# Patient Record
Sex: Female | Born: 1960 | Race: White | Hispanic: No | Marital: Married | Smoking: Current every day smoker
Health system: Southern US, Community
[De-identification: ages and names within clinical notes are randomized; demographics above are authoritative.]

## PROBLEM LIST (undated history)

## (undated) DIAGNOSIS — E785 Hyperlipidemia, unspecified: Secondary | ICD-10-CM

## (undated) DIAGNOSIS — K819 Cholecystitis, unspecified: Secondary | ICD-10-CM

## (undated) DIAGNOSIS — F329 Major depressive disorder, single episode, unspecified: Secondary | ICD-10-CM

## (undated) DIAGNOSIS — F32A Depression, unspecified: Secondary | ICD-10-CM

## (undated) HISTORY — DX: Cholecystitis, unspecified: K81.9

## (undated) HISTORY — DX: Hyperlipidemia, unspecified: E78.5

## (undated) HISTORY — DX: Major depressive disorder, single episode, unspecified: F32.9

## (undated) HISTORY — DX: Depression, unspecified: F32.A

---
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
## 2011-03-21 ENCOUNTER — Emergency Department: Payer: Self-pay | Admitting: Emergency Medicine

## 2014-05-13 ENCOUNTER — Emergency Department: Payer: Self-pay | Admitting: Emergency Medicine

## 2015-07-10 ENCOUNTER — Emergency Department
Admission: EM | Admit: 2015-07-10 | Discharge: 2015-07-10 | Disposition: A | Discharge status: Home / Self Care | Payer: Worker's Compensation | Attending: Emergency Medicine | Admitting: Emergency Medicine

## 2015-07-10 ENCOUNTER — Encounter: Payer: Self-pay | Admitting: Emergency Medicine

## 2015-07-10 DIAGNOSIS — Z880 Allergy status to penicillin: Secondary | ICD-10-CM | POA: Diagnosis not present

## 2015-07-10 DIAGNOSIS — W460XXA Contact with hypodermic needle, initial encounter: Secondary | ICD-10-CM | POA: Diagnosis not present

## 2015-07-10 DIAGNOSIS — Y990 Civilian activity done for income or pay: Secondary | ICD-10-CM | POA: Insufficient documentation

## 2015-07-10 DIAGNOSIS — Y9389 Activity, other specified: Secondary | ICD-10-CM | POA: Insufficient documentation

## 2015-07-10 DIAGNOSIS — Y9289 Other specified places as the place of occurrence of the external cause: Secondary | ICD-10-CM | POA: Insufficient documentation

## 2015-07-10 DIAGNOSIS — S61233A Puncture wound without foreign body of left middle finger without damage to nail, initial encounter: Secondary | ICD-10-CM | POA: Insufficient documentation

## 2015-07-10 DIAGNOSIS — Z720 Tobacco use: Secondary | ICD-10-CM | POA: Insufficient documentation

## 2015-07-10 DIAGNOSIS — Z7721 Contact with and (suspected) exposure to potentially hazardous body fluids: Secondary | ICD-10-CM | POA: Diagnosis present

## 2015-07-10 DIAGNOSIS — IMO0002 Reserved for concepts with insufficient information to code with codable children: Secondary | ICD-10-CM

## 2015-07-10 LAB — CBC WITH DIFFERENTIAL/PLATELET
BASOS PCT: 1 %
Basophils Absolute: 0 10*3/uL (ref 0–0.1)
EOS ABS: 0.1 10*3/uL (ref 0–0.7)
EOS PCT: 1 %
HEMATOCRIT: 42.5 % (ref 35.0–47.0)
Hemoglobin: 14.2 g/dL (ref 12.0–16.0)
Lymphocytes Relative: 43 %
Lymphs Abs: 3.7 10*3/uL — ABNORMAL HIGH (ref 1.0–3.6)
MCH: 31.5 pg (ref 26.0–34.0)
MCHC: 33.5 g/dL (ref 32.0–36.0)
MCV: 94 fL (ref 80.0–100.0)
MONO ABS: 0.6 10*3/uL (ref 0.2–0.9)
MONOS PCT: 7 %
NEUTROS ABS: 4.2 10*3/uL (ref 1.4–6.5)
Neutrophils Relative %: 48 %
PLATELETS: 186 10*3/uL (ref 150–440)
RBC: 4.52 MIL/uL (ref 3.80–5.20)
RDW: 13.6 % (ref 11.5–14.5)
WBC: 8.6 10*3/uL (ref 3.6–11.0)

## 2015-07-10 LAB — COMPREHENSIVE METABOLIC PANEL
ALBUMIN: 4.2 g/dL (ref 3.5–5.0)
ALK PHOS: 110 U/L (ref 38–126)
ALT: 18 U/L (ref 14–54)
ANION GAP: 8 (ref 5–15)
AST: 26 U/L (ref 15–41)
BILIRUBIN TOTAL: 0.4 mg/dL (ref 0.3–1.2)
BUN: 13 mg/dL (ref 6–20)
CALCIUM: 9.3 mg/dL (ref 8.9–10.3)
CO2: 25 mmol/L (ref 22–32)
Chloride: 106 mmol/L (ref 101–111)
Creatinine, Ser: 0.66 mg/dL (ref 0.44–1.00)
GFR calc Af Amer: >60 mL/min (ref >60)
GLUCOSE: 117 mg/dL — AB (ref 65–99)
Potassium: 3.7 mmol/L (ref 3.5–5.1)
Sodium: 139 mmol/L (ref 135–145)
TOTAL PROTEIN: 7.6 g/dL (ref 6.5–8.1)

## 2015-07-10 LAB — URINALYSIS COMPLETE WITH MICROSCOPIC (ARMC ONLY)
BILIRUBIN URINE: NEGATIVE
GLUCOSE, UA: NEGATIVE mg/dL
HGB URINE DIPSTICK: NEGATIVE
KETONES UR: NEGATIVE mg/dL
Leukocytes, UA: NEGATIVE
NITRITE: NEGATIVE
Protein, ur: NEGATIVE mg/dL
RBC / HPF: NONE SEEN RBC/hpf (ref 0–5)
Specific Gravity, Urine: 1.001 — ABNORMAL LOW (ref 1.005–1.030)
pH: 6 (ref 5.0–8.0)

## 2015-07-10 MED ORDER — ZIDOVUDINE 100 MG PO CAPS
300.0000 mg | ORAL_CAPSULE | Freq: Two times a day (BID) | ORAL | Status: DC
  Filled 2015-07-10 (×2): qty 3

## 2015-07-10 MED ORDER — LAMIVUDINE 150 MG PO TABS
150.0000 mg | ORAL_TABLET | Freq: Two times a day (BID) | ORAL | Status: DC
  Filled 2015-07-10 (×2): qty 1

## 2015-07-10 MED ORDER — LAMIVUDINE-ZIDOVUDINE 150-300 MG PO TABS
1.0000 | ORAL_TABLET | Freq: Two times a day (BID) | ORAL | Status: DC
  Filled 2015-07-10 (×2): qty 1

## 2015-07-10 MED ORDER — ZIDOVUDINE 300 MG PO TABS
300.0000 mg | ORAL_TABLET | Freq: Two times a day (BID) | ORAL | Status: DC
  Administered 2015-07-10: 300 mg via ORAL
  Filled 2015-07-10 (×2): qty 1

## 2015-07-10 MED ORDER — LAMIVUDINE-ZIDOVUDINE 150-300 MG PO TABS: 1.0000 | ORAL_TABLET | Freq: Two times a day (BID) | ORAL | Status: DC

## 2015-07-10 MED ORDER — LAMIVUDINE 150 MG PO TABS
150.0000 mg | ORAL_TABLET | Freq: Two times a day (BID) | ORAL | Status: DC
  Administered 2015-07-10: 150 mg via ORAL
  Filled 2015-07-10 (×2): qty 1

## 2015-07-10 NOTE — ED Notes (Signed)
rcvd consent form for HIV and other testing related to exposure

## 2015-07-10 NOTE — ED Notes (Signed)
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 Apparently, a sharp was stuck under the sharps box and she hit it with her left middle finger while reaching for a dropped pill. Pt is alert & oriented with warm, dry skin. Pt wants to review information about prophylaxis treatment before making decision in case there are blood derivatives as she does not take blood products.

## 2015-07-10 NOTE — ED Notes (Signed)
Reports dropping a pill at work and bent down to get it and got stuck by a needle that was under the needle box

## 2015-07-10 NOTE — ED Provider Notes (Addendum)
[facility] Emergency Department Provider Note  ____________________________________________  Time seen: Approximately 9 AM  I have reviewed the triage vital signs and the nursing notes.   HISTORY  Chief Complaint Body Fluid Exposure    HPI Tiffany Shea is a 54 y.o. female without any previous medical problems who is presenting today after a needlestick at 12 AM this morning. 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 She looked down she saw a needle that had stuck her. She said that she saw a small drop of blood with a needle inserted in her finger. She going quickly washed her finger and there has been no further bleeding. She says that the needle may have prolonged to one of 2 patients, one of which who is hepatitis C positive. The patient is reporting to the emergency department today for screening for communicable diseases as well as possible prophylaxis.She denied seeing any blood on the needle itself. Unclear of the bore of the needle.   History reviewed. No pertinent past medical history.  There are no active problems to display for this patient.   History reviewed. No pertinent past surgical history.  No current outpatient prescriptions on file.  Allergies Penicillins  History reviewed. No pertinent family history.  Social History Social History  Substance Use Topics  . Smoking status: Current Every Day Smoker  . Smokeless tobacco: None  . Alcohol Use: No    Review of Systems Constitutional: No fever/chills Eyes: No visual changes. ENT: No sore throat. Cardiovascular: Denies chest pain. Respiratory: Denies shortness of breath. Gastrointestinal: No abdominal pain.  No nausea, no vomiting.  No diarrhea.  No constipation. Genitourinary: Negative for dysuria. Musculoskeletal: Negative for back pain. Skin: Negative for rash. Neurological: Negative for headaches,  focal weakness or numbness.  10-point ROS otherwise negative.  ____________________________________________   PHYSICAL EXAM:  VITAL SIGNS: ED Triage Vitals  Enc Vitals Group     BP 07/10/15 0840 128/67 mmHg     Pulse Rate 07/10/15 0840 71     Resp 07/10/15 0840 16     Temp 07/10/15 0840 97.5 F (36.4 C)     Temp Source 07/10/15 0840 Oral     SpO2 07/10/15 0840 99 %     Weight 07/10/15 0840 170 lb (77.111 kg)     Height 07/10/15 0840  (1.651 m)     Head Cir --      Peak Flow --      Pain Score --      Pain Loc --      Pain Edu? --      Excl. in GC? --     Constitutional: Alert and oriented. Well appearing and in no acute distress. Eyes: Conjunctivae are normal. PERRL. EOMI. Head: Atraumatic. Nose: No congestion/rhinnorhea. Mouth/Throat: Mucous membranes are moist.  Oropharynx non-erythematous. Neck: No stridor.   Cardiovascular: Normal rate, regular rhythm. Grossly normal heart sounds.  Good peripheral circulation. Respiratory: Normal respiratory effort.  No retractions. Lungs CTAB. Gastrointestinal: Soft and nontender. No distention. No abdominal bruits. No CVA tenderness. Musculoskeletal: No lower extremity tenderness nor edema.  No joint effusions. Neurologic:  Normal speech and language. No gross focal neurologic deficits are appreciated. No gait instability. Skin:  Skin is warm, dry and intact. No rash noted. No lesion or scarring or evidence of trauma noted to the left middle finger distal phalanx with the patient says that she  suffered the needle stick. Psychiatric: Mood and affect are normal. Speech and behavior are normal.  ____________________________________________   LABS (all labs ordered are listed, but only abnormal results are displayed)  Labs Reviewed  CBC WITH DIFFERENTIAL/PLATELET - Abnormal; Notable for the following:    Lymphs Abs 3.7 (*)    All other components within normal limits  URINALYSIS COMPLETEWITH MICROSCOPIC ([facility] ONLY) -  Abnormal; Notable for the following:    Color, Urine COLORLESS (*)    APPearance CLEAR (*)    Specific Gravity, Urine 1.001 (*)    Bacteria, UA RARE (*)    Squamous Epithelial / LPF 0-5 (*)    All other components within normal limits  COMPREHENSIVE METABOLIC PANEL - Abnormal; Notable for the following:    Glucose, Bld 117 (*)    All other components within normal limits   ____________________________________________  EKG   ____________________________________________  RADIOLOGY   ____________________________________________   PROCEDURES    ____________________________________________   INITIAL IMPRESSION / ASSESSMENT AND PLAN / ED COURSE  Pertinent labs & imaging results that were available during my care of the patient were reviewed by me and considered in my medical decision making (see chart for details).  ----------------------------------------- 11:47 AM on 07/10/2015 -----------------------------------------  He is with reassuring lab work and urinalysis. Will prescribe Combivir secondary to risk of transmission of communicable disease. I discussed thoroughly with the patient the risks and benefits of the post exposure prophylaxis. She also reviewed the [place] packet for post exposure prophylaxis with me and opted to be prescribed Combivir. She signed the necessary paperwork. She will be discharged home and will [facility] for repeat testing secondary to her postoperative prophylaxis. She will also follow up with her employer to obtain Workmen's Comp.   ____________________________________________   FINAL CLINICAL IMPRESSION(S) / ED DIAGNOSES  Acute needle stick to the left middle finger. Initial visit.    Myrna Blazer, MD 07/10/15 1149  Patient says she had already denies it is as a child but does not know of any results of any hepatitis B titer since.  Myrna Blazer, MD 07/10/15 [phone]

## 2015-08-15 ENCOUNTER — Emergency Department
Admission: EM | Admit: 2015-08-15 | Discharge: 2015-08-15 | Disposition: A | Discharge status: Home / Self Care | Payer: Self-pay | Attending: Emergency Medicine | Admitting: Emergency Medicine

## 2015-08-15 DIAGNOSIS — Z720 Tobacco use: Secondary | ICD-10-CM | POA: Insufficient documentation

## 2015-08-15 DIAGNOSIS — Z880 Allergy status to penicillin: Secondary | ICD-10-CM | POA: Insufficient documentation

## 2015-08-15 DIAGNOSIS — Z79899 Other long term (current) drug therapy: Secondary | ICD-10-CM | POA: Insufficient documentation

## 2015-08-15 DIAGNOSIS — M62830 Muscle spasm of back: Secondary | ICD-10-CM | POA: Insufficient documentation

## 2015-08-15 LAB — CBC WITH DIFFERENTIAL/PLATELET
Basophils Absolute: 0 10*3/uL (ref 0–0.1)
Basophils Relative: 0 %
EOS ABS: 0 10*3/uL (ref 0–0.7)
EOS PCT: 0 %
HCT: 41.2 % (ref 35.0–47.0)
Hemoglobin: 14.3 g/dL (ref 12.0–16.0)
LYMPHS ABS: 3.5 10*3/uL (ref 1.0–3.6)
LYMPHS PCT: 32 %
MCH: 34.2 pg — AB (ref 26.0–34.0)
MCHC: 34.6 g/dL (ref 32.0–36.0)
MCV: 98.8 fL (ref 80.0–100.0)
MONO ABS: 0.9 10*3/uL (ref 0.2–0.9)
Monocytes Relative: 9 %
Neutro Abs: 6.4 10*3/uL (ref 1.4–6.5)
Neutrophils Relative %: 59 %
Platelets: 181 10*3/uL (ref 150–440)
RBC: 4.17 MIL/uL (ref 3.80–5.20)
RDW: 17.4 % — ABNORMAL HIGH (ref 11.5–14.5)
WBC: 10.9 10*3/uL (ref 3.6–11.0)

## 2015-08-15 LAB — COMPREHENSIVE METABOLIC PANEL
ALK PHOS: 97 U/L (ref 38–126)
ALT: 30 U/L (ref 14–54)
ANION GAP: 7 (ref 5–15)
AST: 38 U/L (ref 15–41)
Albumin: 4 g/dL (ref 3.5–5.0)
BUN: 10 mg/dL (ref 6–20)
CALCIUM: 9.1 mg/dL (ref 8.9–10.3)
CO2: 24 mmol/L (ref 22–32)
CREATININE: 0.82 mg/dL (ref 0.44–1.00)
Chloride: 106 mmol/L (ref 101–111)
Glucose, Bld: 121 mg/dL — ABNORMAL HIGH (ref 65–99)
Potassium: 4.7 mmol/L (ref 3.5–5.1)
SODIUM: 137 mmol/L (ref 135–145)
TOTAL PROTEIN: 7.2 g/dL (ref 6.5–8.1)
Total Bilirubin: 0.8 mg/dL (ref 0.3–1.2)

## 2015-08-15 LAB — LIPASE, BLOOD: LIPASE: 27 U/L (ref 22–51)

## 2015-08-15 MED ORDER — DIAZEPAM 5 MG PO TABS
2.5000 mg | ORAL_TABLET | Freq: Once | ORAL | Status: AC
  Administered 2015-08-15: 2.5 mg via ORAL
  Filled 2015-08-15: qty 1

## 2015-08-15 MED ORDER — TRAMADOL HCL 50 MG PO TABS
100.0000 mg | ORAL_TABLET | Freq: Once | ORAL | Status: AC
  Administered 2015-08-15: 100 mg via ORAL
  Filled 2015-08-15: qty 2

## 2015-08-15 MED ORDER — DIAZEPAM 2 MG PO TABS: 2.0000 mg | ORAL_TABLET | Freq: Once | ORAL | Status: DC

## 2015-08-15 MED ORDER — TRAMADOL HCL 50 MG PO TABS: 50.0000 mg | ORAL_TABLET | Freq: Four times a day (QID) | ORAL | Status: AC | PRN

## 2015-08-15 NOTE — ED Provider Notes (Signed)
[facility] Emergency Department Provider Note  ____________________________________________  Time seen: 1500   I have reviewed the triage vital signs and the nursing notes.   HISTORY  Chief Complaint Back pain    HPI Tiffany Shea is a 54 y.o. female who presents to the emergency Department complaining about back pain that wraps around her torso. This is acute onset today at approximately 12:00. She reports she was getting out of her car when this pain struck suddenly. She reports pain with movement in her mid back, bilateral.. She denies any nausea, vomiting, or other GI symptoms. She apparently had complained about upper abdominal pain to the nurses in triage. I spoke with the patient about this directly. She denies abdominal pain, but simply the pain wrapped around into that area some. The pain is not as bad now as it was initially. She reports she felt as though her back was spasming. She denies any tearing or other suspicious descriptions of this pain. She denies any pain radiating down into her legs or dysfunction with her legs.    History reviewed. No pertinent past medical history.  There are no active problems to display for this patient.   History reviewed. No pertinent past surgical history.  Current Outpatient Rx  Name  Route  Sig  Dispense  Refill  . diazepam (VALIUM) 2 MG tablet   Oral   Take 1 tablet (2 mg total) by mouth once.   12 tablet   0   . lamiVUDine-zidovudine (COMBIVIR) 150-300 MG per tablet   Oral   Take 1 tablet by mouth 2 (two) times daily.   56 tablet   0   . traMADol (ULTRAM) 50 MG tablet   Oral   Take 1 tablet (50 mg total) by mouth every 6 (six) hours as needed.   20 tablet   0     Allergies Penicillins  History reviewed. No pertinent family history.  Social History Social History  Substance Use Topics  . Smoking status: Current Every Day Smoker  . Smokeless tobacco: None  . Alcohol Use: No    Review of  Systems  Constitutional: Negative for fever. ENT: Negative for sore throat. Cardiovascular: Negative for chest pain. Respiratory: Negative for cough. Gastrointestinal: Negative for abdominal pain, vomiting and diarrhea. Genitourinary: Negative for dysuria. Musculoskeletal: Back pain in the mid back, lower thoracic or see history of present illness. Skin: Negative for rash. Neurological: Negative for paresthesia or weakness   10-point ROS otherwise negative.  ____________________________________________   PHYSICAL EXAM:  VITAL SIGNS: ED Triage Vitals  Enc Vitals Group     BP 08/15/15 1252 138/63 mmHg     Pulse Rate 08/15/15 1252 67     Resp 08/15/15 1252 16     Temp 08/15/15 1252 97.6 F (36.4 C)     Temp Source 08/15/15 1252 Oral     SpO2 08/15/15 1252 97 %     Weight 08/15/15 1252 165 lb (74.844 kg)     Height 08/15/15 1252  (1.651 m)     Head Cir --      Peak Flow --      Pain Score 08/15/15 1252 7     Pain Loc --      Pain Edu? --      Excl. in GC? --     Constitutional:  Alert and oriented. Well appearing and in no distress. ENT   Head: Normocephalic and atraumatic.   Nose: No congestion/rhinnorhea.  Cardiovascular: Normal rate, regular rhythm, no murmur noted Respiratory:  Normal respiratory effort, no tachypnea.    Breath sounds are clear and equal bilaterally.  Gastrointestinal: Soft and nontender. No distention.  Back: Patient with tenderness and muscle spasm bilaterally in the lower thoracic area. Paraspinal muscles are notably tender and tight. She reports that the examination itself feels good and helpful. Of note, she has no neurologic dysfunction noted in her legs. She has equal pulses bilaterally both at the femoral level as well as pedal level. Negative straight leg raise bilaterally. Musculoskeletal: No deformity noted. Nontender with normal range of motion in all extremities.  No noted edema. Neurologic:  Normal speech and language. No  gross focal neurologic deficits are appreciated. Intact sensation in bilateral feet and legs with good motion. Skin:  Skin is warm, dry. No rash noted. Psychiatric: Mood and affect are normal. Speech and behavior are normal.  ____________________________________________    LABS (pertinent positives/negatives)  Labs Reviewed  CBC WITH DIFFERENTIAL/PLATELET - Abnormal; Notable for the following:    MCH 34.2 (*)    RDW 17.4 (*)    All other components within normal limits  COMPREHENSIVE METABOLIC PANEL - Abnormal; Notable for the following:    Glucose, Bld 121 (*)    All other components within normal limits  LIPASE, BLOOD  URINALYSIS COMPLETEWITH MICROSCOPIC ([facility] ONLY)    ____________________________________________   EKG  ED ECG REPORT I, Kelsen Celona W, the attending physician, personally viewed and interpreted this ECG.   Date: 08/15/2015  EKG Time: 1300  Rate: 55  Rhythm: Sinus bradycardia  Axis: Normal  Intervals: Normal  ST&T Change: None noted   ____________________________________________ ____________________________________________   INITIAL IMPRESSION / ASSESSMENT AND PLAN / ED COURSE  Pertinent labs & imaging results that were available during my care of the patient were reviewed by me and considered in my medical decision making (see chart for details).   Pleasant 54 year old female in mild discomfort but no acute distress. She is complaining of pain in her lower thoracic back. She has notable tenderness on exam of this area with muscle spasm. She has no tenderness in her abdomen.   I discussed the differential diagnosis with the patient. I have very low suspicion for vascular issues. The patient I discussed that this is most likely musculoskeletal. We discussed the pros and cons of further testing and she agrees on no further testing at this time.  We will treat her pain with tramadol and we will use Valium for some mild muscle relaxation.  I have  counseled the patient on ongoing light activity and to follow-up with a regular physician. She does not have a regular physician this time.  ____________________________________________   FINAL CLINICAL IMPRESSION(S) / ED DIAGNOSES  Final diagnoses:  Back spasm   back pain    Darien Ramusavid W Jayesh Marbach, MD 08/15/15 1555

## 2015-08-15 NOTE — Discharge Instructions (Signed)
Muscle Cramps and Spasms Muscle cramps and spasms are when muscles tighten by themselves. They usually get better within minutes. Muscle cramps are painful. They are usually stronger and last longer than muscle spasms. Muscle spasms may or may not be painful. They can last a few seconds or much longer. HOME CARE  Drink enough fluid to keep your pee (urine) clear or pale yellow.  Massage, stretch, and relax the muscle.  Use a warm towel, heating pad, or warm shower water on tight muscles.  Place ice on the muscle if it is tender or in pain.  Put ice in a plastic bag.  Place a towel between your skin and the bag.  Leave the ice on for 15-20 minutes, 03-04 times a day.  Only take medicine as told by your doctor. GET HELP RIGHT AWAY IF:  Your cramps or spasms get worse, happen more often, or do not get better with time. MAKE SURE YOU:  Understand these instructions.  Will watch your condition.  Will get help right away if you are not doing well or get worse.   This information is not intended to replace advice given to you by your health care provider. Make sure you discuss any questions you have with your health care provider.   Document Released: 10/02/2008 Document Revised: 02/14/2013 Document Reviewed: 10/06/2012 Elsevier Interactive Patient Education 2016 [employer]  

## 2015-08-15 NOTE — ED Notes (Signed)
Patient had sudden onset of right and left upper quadrant pain that started at 12:30.  Last BM 08/14/15.  Patient is passing gas.

## 2016-11-30 ENCOUNTER — Emergency Department: Payer: Self-pay

## 2016-11-30 ENCOUNTER — Observation Stay
Admission: EM | Admit: 2016-11-30 | Discharge: 2016-12-02 | Disposition: A | Discharge status: Home / Self Care | Payer: Self-pay | Attending: General Surgery | Admitting: General Surgery

## 2016-11-30 ENCOUNTER — Encounter: Payer: Self-pay | Admitting: Emergency Medicine

## 2016-11-30 DIAGNOSIS — Z79899 Other long term (current) drug therapy: Secondary | ICD-10-CM | POA: Insufficient documentation

## 2016-11-30 DIAGNOSIS — R1010 Upper abdominal pain, unspecified: Secondary | ICD-10-CM

## 2016-11-30 DIAGNOSIS — Z7982 Long term (current) use of aspirin: Secondary | ICD-10-CM | POA: Insufficient documentation

## 2016-11-30 DIAGNOSIS — K819 Cholecystitis, unspecified: Secondary | ICD-10-CM | POA: Diagnosis present

## 2016-11-30 DIAGNOSIS — K8010 Calculus of gallbladder with chronic cholecystitis without obstruction: Principal | ICD-10-CM | POA: Insufficient documentation

## 2016-11-30 DIAGNOSIS — F17210 Nicotine dependence, cigarettes, uncomplicated: Secondary | ICD-10-CM | POA: Insufficient documentation

## 2016-11-30 DIAGNOSIS — R109 Unspecified abdominal pain: Secondary | ICD-10-CM

## 2016-11-30 LAB — COMPREHENSIVE METABOLIC PANEL
ALT: 44 U/L (ref 14–54)
AST: 55 U/L — AB (ref 15–41)
Albumin: 4 g/dL (ref 3.5–5.0)
Alkaline Phosphatase: 98 U/L (ref 38–126)
Anion gap: 10 (ref 5–15)
BILIRUBIN TOTAL: 0.8 mg/dL (ref 0.3–1.2)
BUN: 9 mg/dL (ref 6–20)
CHLORIDE: 104 mmol/L (ref 101–111)
CO2: 24 mmol/L (ref 22–32)
CREATININE: 0.74 mg/dL (ref 0.44–1.00)
Calcium: 8.4 mg/dL — ABNORMAL LOW (ref 8.9–10.3)
Glucose, Bld: 186 mg/dL — ABNORMAL HIGH (ref 65–99)
Potassium: 3.3 mmol/L — ABNORMAL LOW (ref 3.5–5.1)
Sodium: 138 mmol/L (ref 135–145)
TOTAL PROTEIN: 7.3 g/dL (ref 6.5–8.1)

## 2016-11-30 LAB — URINALYSIS, COMPLETE (UACMP) WITH MICROSCOPIC
Bilirubin Urine: NEGATIVE
GLUCOSE, UA: NEGATIVE mg/dL
HGB URINE DIPSTICK: NEGATIVE
Ketones, ur: NEGATIVE mg/dL
Leukocytes, UA: NEGATIVE
Nitrite: NEGATIVE
PH: 6 (ref 5.0–8.0)
Protein, ur: NEGATIVE mg/dL
SPECIFIC GRAVITY, URINE: 1.003 — AB (ref 1.005–1.030)

## 2016-11-30 LAB — CBC WITH DIFFERENTIAL/PLATELET
BASOS ABS: 0 10*3/uL (ref 0–0.1)
Basophils Relative: 1 %
EOS PCT: 0 %
Eosinophils Absolute: 0 10*3/uL (ref 0–0.7)
HCT: 39.2 % (ref 35.0–47.0)
Hemoglobin: 13.9 g/dL (ref 12.0–16.0)
LYMPHS ABS: 2.9 10*3/uL (ref 1.0–3.6)
LYMPHS PCT: 29 %
MCH: 32.6 pg (ref 26.0–34.0)
MCHC: 35.4 g/dL (ref 32.0–36.0)
MCV: 92.1 fL (ref 80.0–100.0)
MONO ABS: 1 10*3/uL — AB (ref 0.2–0.9)
Monocytes Relative: 10 %
NEUTROS ABS: 6.2 10*3/uL (ref 1.4–6.5)
Neutrophils Relative %: 60 %
PLATELETS: 172 10*3/uL (ref 150–440)
RBC: 4.25 MIL/uL (ref 3.80–5.20)
RDW: 13.5 % (ref 11.5–14.5)
WBC: 10.1 10*3/uL (ref 3.6–11.0)

## 2016-11-30 LAB — LIPASE, BLOOD: LIPASE: 20 U/L (ref 11–51)

## 2016-11-30 LAB — TROPONIN I

## 2016-11-30 LAB — MRSA PCR SCREENING: MRSA BY PCR: NEGATIVE

## 2016-11-30 MED ORDER — LACTATED RINGERS IV SOLN
INTRAVENOUS | Status: DC
  Administered 2016-11-30 – 2016-12-02 (×4): via INTRAVENOUS

## 2016-11-30 MED ORDER — DIPHENHYDRAMINE HCL 25 MG PO CAPS: 25.0000 mg | ORAL_CAPSULE | Freq: Four times a day (QID) | ORAL | Status: DC | PRN

## 2016-11-30 MED ORDER — FENTANYL CITRATE (PF) 100 MCG/2ML IJ SOLN
50.0000 ug | Freq: Once | INTRAMUSCULAR | Status: AC
  Administered 2016-11-30: 50 ug via INTRAVENOUS
  Filled 2016-11-30: qty 2

## 2016-11-30 MED ORDER — SODIUM CHLORIDE 0.9 % IV BOLUS (SEPSIS)
1000.0000 mL | Freq: Once | INTRAVENOUS | Status: AC
  Administered 2016-11-30: 1000 mL via INTRAVENOUS

## 2016-11-30 MED ORDER — PANTOPRAZOLE SODIUM 40 MG IV SOLR
40.0000 mg | Freq: Every day | INTRAVENOUS | Status: DC
  Administered 2016-11-30 – 2016-12-01 (×2): 40 mg via INTRAVENOUS
  Filled 2016-11-30 (×2): qty 40

## 2016-11-30 MED ORDER — ONDANSETRON 4 MG PO TBDP: 4.0000 mg | ORAL_TABLET | Freq: Four times a day (QID) | ORAL | Status: DC | PRN

## 2016-11-30 MED ORDER — ONDANSETRON HCL 4 MG/2ML IJ SOLN
4.0000 mg | Freq: Four times a day (QID) | INTRAMUSCULAR | Status: DC | PRN
  Administered 2016-12-01 (×2): 4 mg via INTRAVENOUS
  Filled 2016-11-30: qty 2

## 2016-11-30 MED ORDER — ONDANSETRON HCL 4 MG/2ML IJ SOLN
4.0000 mg | Freq: Once | INTRAMUSCULAR | Status: AC
  Administered 2016-11-30: 4 mg via INTRAVENOUS
  Filled 2016-11-30: qty 2

## 2016-11-30 MED ORDER — NICOTINE 21 MG/24HR TD PT24
21.0000 mg | MEDICATED_PATCH | Freq: Every day | TRANSDERMAL | Status: DC
  Filled 2016-11-30: qty 1

## 2016-11-30 MED ORDER — OXYCODONE-ACETAMINOPHEN 5-325 MG PO TABS: 1.0000 | ORAL_TABLET | Freq: Four times a day (QID) | ORAL | 0 refills | Status: DC | PRN

## 2016-11-30 MED ORDER — CIPROFLOXACIN IN D5W 400 MG/200ML IV SOLN
400.0000 mg | Freq: Two times a day (BID) | INTRAVENOUS | Status: DC
  Administered 2016-11-30 – 2016-12-02 (×4): 400 mg via INTRAVENOUS
  Filled 2016-11-30 (×6): qty 200

## 2016-11-30 MED ORDER — HYDRALAZINE HCL 20 MG/ML IJ SOLN: 10.0000 mg | INTRAMUSCULAR | Status: DC | PRN

## 2016-11-30 MED ORDER — MORPHINE SULFATE (PF) 4 MG/ML IV SOLN: 4.0000 mg | INTRAVENOUS | Status: DC | PRN

## 2016-11-30 MED ORDER — DIPHENHYDRAMINE HCL 50 MG/ML IJ SOLN: 25.0000 mg | Freq: Four times a day (QID) | INTRAMUSCULAR | Status: DC | PRN

## 2016-11-30 MED ORDER — OXYCODONE HCL 5 MG PO TABS: 5.0000 mg | ORAL_TABLET | ORAL | Status: DC | PRN

## 2016-11-30 NOTE — ED Notes (Signed)
Patient reports mid abdominal pain that radiates from left to right that pain increases and decreases in intensity.  Patient reports vomited earlier.

## 2016-11-30 NOTE — Consult Note (Signed)
Patient ID: Tiffany Shea, female   DOB: 1961-01-22, 56 y.o.   MRN: [id number]  CC: Abdominal Pain  HPI Tiffany Shea is a 56 y.o. female who presents to the ER today complaining of pain in her upper abdomen and her back. She states that at the time of my consultation her pain was completely gone. She states she's been told she had gallstones in the past and thinks she's had pain like this within the last year. She's had numerous sick contacts this week including her family that all has the flu. She states she has been vomiting but not in the last 24 hours. Pain started yesterday and she cannot correlate it to eating or drinking. She denies any current fevers, chills, chest pain, shortness of breath. She's been having nausea without vomiting and intermittent diarrhea and constipation. Again currently she states that she is pain-free.  HPI  History reviewed. No pertinent past medical history. Patient currently not taking any medications.  History reviewed. No pertinent surgical history. Patient denies ever having had surgery before  History reviewed. family history. Patient states that her father has had lung cancer but denies any heart disease or diabetes.  Social History Social History  Substance Use Topics  . Smoking status: Current Every Day Smoker    Packs/day: 1.00    Types: Cigarettes  . Smokeless tobacco: Never Used  . Alcohol use No    Allergies  Allergen Reactions  . Penicillins Rash    No current facility-administered medications for this encounter.    Current Outpatient Prescriptions  Medication Sig Dispense Refill  . aspirin 325 MG tablet Take 325 mg by mouth daily.    . diazepam (VALIUM) 2 MG tablet Take 1 tablet (2 mg total) by mouth once. 12 tablet 0  . lamiVUDine-zidovudine (COMBIVIR) 150-300 MG per tablet Take 1 tablet by mouth 2 (two) times daily. 56 tablet 0     Review of Systems A Multi-point review of systems was asked and was negative except for the findings  documented in the history of present illness   Physical Exam Blood pressure (!) 167/68, pulse (!) 51, temperature 98 F (36.7 C), temperature source Oral, resp. rate 18, height 5\' 4"  (1.626 m), weight 76.2 kg (168 lb), SpO2 98 %. CONSTITUTIONAL: Resting in bed in no acute distress. EYES: Pupils are equal, round, and reactive to light, Sclera are non-icteric. EARS, NOSE, MOUTH AND THROAT: The oropharynx is clear. The oral mucosa is pink and moist. Hearing is intact to voice. LYMPH NODES:  Lymph nodes in the neck are normal. RESPIRATORY:  Lungs are clear. There is normal respiratory effort, with equal breath sounds bilaterally, and without pathologic use of accessory muscles. CARDIOVASCULAR: Heart is regular without murmurs, gallops, or rubs. GI: The abdomen is soft, nontender, and nondistended. There are no palpable masses. There is no hepatosplenomegaly. There are normal bowel sounds in all quadrants. GU: Rectal deferred.   MUSCULOSKELETAL: Normal muscle strength and tone. No cyanosis or edema.   SKIN: Turgor is good and there are no pathologic skin lesions or ulcers. NEUROLOGIC: Motor and sensation is grossly normal. Cranial nerves are grossly intact. PSYCH:  Oriented to person, place and time. Affect is normal.  Data Reviewed Images and labs reviewed. Labs show mild hypokalemia of 3.3 and a mild elevation of AST to 55 but all the rest of her labs are within normal limits including a normal alkaline phosphatase at 98, normal ALC of 44, and normal bilirubin of 0.8, normal white blood cell  count of 10.1. Ultrasound was reviewed by myself. The images are of poor quality and the measured gallbladder wall thickening is from a questionable location. However, it is interpreted as having a gallbladder wall thickening with pericholecystic fluid which would be consistent with cholecystitis. I have personally reviewed the patient's imaging, laboratory findings and medical records.    Assessment     Abdominal pain    Plan    56 year old female with a history of abdominal pain that is currently completely resolved. Imaging suggestive of acute cholecystitis but clinical exam is not. Due to this discrepancy discussed with the patient that we would attempt a by mouth challenge. If she is able to tolerate a diet and she will be discharged home with follow-up in clinic later this week to discuss further workup of biliary disease. Should she fail at a by mouth challenge then she will be admitted for hydration and likely undergo a laparoscopic cholecystectomy in the next 24-48 hours. All questions answered to the patient's satisfaction and her plan is pending whether or not she tolerates a diet.     Time spent with the patient was 45 minutes, with more than 50% of the time spent in face-to-face education, counseling and care coordination.     Ricarda Frameharles Jashawna Reever, MD FACS General Surgeon 11/30/2016, 8:23 AM

## 2016-11-30 NOTE — ED Triage Notes (Signed)
Pt ambulatory to triage c/o upper abdominal pain (since yesterday) orienting left to right (similar incident approx 7 months ago dx of muscle spasm), N/Vx3 yesterday.  Denies weakness, dizziness, radiating pain, or SOB.  Pt reports difficulty urinating, urge but minimal flow, and burning sensation.  Pt reports taking OTC meds for muscle spasms that has alleviated some of the pain.

## 2016-11-30 NOTE — ED Notes (Signed)
Pt tolerated crackers and applesauce; denies nausea or pain

## 2016-11-30 NOTE — ED Provider Notes (Signed)
Seen by Dr. Tonita CongWoodham of surgery. He notes she is completely pain free and recommends PO challenge. If no further pain outpatient surgery follow up with him, if pain he will admit   Tiffany Everyobert Lamoyne Palencia, MD 11/30/16 [phone]

## 2016-11-30 NOTE — ED Provider Notes (Signed)
Seen again by Dr. Tonita CongWoodham and myself. Patient offered choice of admission but she notes she feels well and wants to go home, she will follow with Dr. Tonita CongWoodham as outpatient   Jene Everyobert Kimberly Coye, MD 11/30/16 [phone]

## 2016-11-30 NOTE — ED Provider Notes (Addendum)
[facility] Emergency Department Provider Note  ____________________________________________   I have reviewed the triage vital signs and the nursing notes.   HISTORY  Chief Complaint Abdominal Pain    HPI Tiffany Shea is a 56 y.o. female 's today complaining of abdominal pain. Patient had had a gallstone in the past, she thinks about 10 years ago which caused similar pain. Since that time she's had intermittent pain. She never to have surgery. She denies any fever or chills. She has been vomiting. Pain this time started yesterday morning. Worse with food. No fever. No dysuria or urinary frequency or diarrhea. She has had some decreased urination she states probably because she states she has not had much to eat or drink since yesterday. She denies any chest pain.He is a sharp bandlike pain that hurts with stays in the right upper quadrant but does go around towards the left.      History reviewed. No pertinent past medical history.  There are no active problems to display for this patient.   History reviewed. No pertinent surgical history.  Prior to Admission medications   Medication Sig Start Date End Date Taking? Authorizing Provider  diazepam (VALIUM) 2 MG tablet Take 1 tablet (2 mg total) by mouth once. 08/15/15   Darien Ramus, MD  lamiVUDine-zidovudine (COMBIVIR) 150-300 MG per tablet Take 1 tablet by mouth 2 (two) times daily. 07/10/15   Myrna Blazer, MD    Allergies Penicillins  History reviewed. No pertinent family history.  Social History Social History  Substance Use Topics  . Smoking status: Current Every Day Smoker    Packs/day: 1.00    Types: Cigarettes  . Smokeless tobacco: Never Used  . Alcohol use No    Review of Systems Constitutional: No fever/chills Eyes: No visual changes. ENT: No sore throat. No stiff neck no neck pain Cardiovascular: Denies chest pain. Respiratory: Denies shortness of  breath. Gastrointestinal:   + vomiting.  No diarrhea.  No constipation. Genitourinary: Negative for dysuria. Musculoskeletal: Negative lower extremity swelling Skin: Negative for rash. Neurological: Negative for severe headaches, focal weakness or numbness. 10-point ROS otherwise negative.  ____________________________________________   PHYSICAL EXAM:  VITAL SIGNS: ED Triage Vitals  Enc Vitals Group     BP 11/30/16 0500 (!) 167/68     Pulse Rate 11/30/16 0500 (!) 51     Resp 11/30/16 0500 18     Temp 11/30/16 0500 98 F (36.7 C)     Temp Source 11/30/16 0500 Oral     SpO2 11/30/16 0500 98 %     Weight 11/30/16 0501 168 lb (76.2 kg)     Height 11/30/16 0501 5\' 4"  (1.626 m)     Head Circumference --      Peak Flow --      Pain Score 11/30/16 0501 4     Pain Loc --      Pain Edu? --      Excl. in GC? --     Constitutional: Alert and oriented. Well appearing and in no acute distress. Eyes: Conjunctivae are normal. PERRL. EOMI. Head: Atraumatic. Nose: No congestion/rhinnorhea. Mouth/Throat: Mucous membranes are moist.  Oropharynx non-erythematous. Neck: No stridor.   Nontender with no meningismus Cardiovascular: Normal rate, regular rhythm. Grossly normal heart sounds.  Good peripheral circulation. Respiratory: Normal respiratory effort.  No retractions. Lungs CTAB. Abdominal: Soft and Positive tenderness palpation right upper quadrant, patient with voluntary guarding no rebound. No distention.  Back:  There is no focal tenderness  or step off.  there is no midline tenderness there are no lesions noted. there is no CVA tenderness Musculoskeletal: No lower extremity tenderness, no upper extremity tenderness. No joint effusions, no DVT signs strong distal pulses no edema Neurologic:  Normal speech and language. No gross focal neurologic deficits are appreciated.  Skin:  Skin is warm, dry and intact. No rash noted. Psychiatric: Mood and affect are normal. Speech and behavior are  normal.  ____________________________________________   LABS (all labs ordered are listed, but only abnormal results are displayed)  Labs Reviewed  URINALYSIS, COMPLETE (UACMP) WITH MICROSCOPIC - Abnormal; Notable for the following:       Result Value   Color, Urine STRAW (*)    APPearance CLEAR (*)    Specific Gravity, Urine 1.003 (*)    Bacteria, UA RARE (*)    Squamous Epithelial / LPF 0-5 (*)    All other components within normal limits  CBC WITH DIFFERENTIAL/PLATELET - Abnormal; Notable for the following:    Monocytes Absolute 1.0 (*)    All other components within normal limits  COMPREHENSIVE METABOLIC PANEL - Abnormal; Notable for the following:    Potassium 3.3 (*)    Glucose, Bld 186 (*)    Calcium 8.4 (*)    AST 55 (*)    All other components within normal limits  TROPONIN I  LIPASE, BLOOD   ____________________________________________  EKG  I personally interpreted any EKGs ordered by me or triage  Regarding rate 54 bpm no acute ST elevation or acute ST depression normal axis positive gassy changes no acute ischemia ____________________________________________  RADIOLOGY  I reviewed any imaging ordered by me or triage that were performed during my shift and, if possible, patient and/or family made aware of any abnormal findings. ____________________________________________   PROCEDURES  Procedure(s) performed: None  Procedures  Critical Care performed: None  ____________________________________________   INITIAL IMPRESSION / ASSESSMENT AND PLAN / ED COURSE  Pertinent labs & imaging results that were available during my care of the patient were reviewed by me and considered in my medical decision making (see chart for details).  Patient with refusal right upper quadrant pain since yesterday. Blood work reassuring although AST is slightly elevated. Lipase is pending. We'll obtain A neurological motor disease, patient nontoxic, she has tenderness to  palpation just does not currently have a surgical abdomen. We'll give her IV fluids, nausea medication, pain medication and continue to monitor her closely.   ----------------------------------------- 7:45 AM on 11/30/2016 -----------------------------------------  Likely cholecystitis. Signed out to dr. Cyril Loosenkinner.    ____________________________________________   FINAL CLINICAL IMPRESSION(S) / ED DIAGNOSES  Final diagnoses:  Abdominal pain      This chart was dictated using voice recognition software.  Despite best efforts to proofread,  errors can occur which can change meaning.      Jeanmarie PlantJames A Alexzandrea Normington, MD 11/30/16 [phone]    Jeanmarie PlantJames A Damek Ende, MD 11/30/16 [phone]

## 2016-11-30 NOTE — ED Notes (Signed)
EDP at bedside  

## 2016-11-30 NOTE — ED Notes (Signed)
Pt resting.

## 2016-11-30 NOTE — H&P (Signed)
Patient ID: Tiffany Shea, female   DOB: Sep 19, 1961, 56 y.o.   MRN: [id number]  CC: Abdominal Pain  HPI Tiffany DurieMary Iseman is a 56 y.o. female who presents to the ER today complaining of pain in her upper abdomen and her back. She states that at the time of my consultation her pain was completely gone. She states she's been told she had gallstones in the past and thinks she's had pain like this within the last year. She's had numerous sick contacts this week including her family that all has the flu. She states she has been vomiting but not in the last 24 hours. Pain started yesterday and she cannot correlate it to eating or drinking. She denies any current fevers, chills, chest pain, shortness of breath. She's been having nausea without vomiting and intermittent diarrhea and constipation. Again currently she states that she is pain-free.  HPI  History reviewed. No pertinent past medical history. Patient currently not taking any medications.  History reviewed. No pertinent surgical history. Patient denies ever having had surgery before  History reviewed. family history. Patient states that her father has had lung cancer but denies any heart disease or diabetes.  Social History      Social History  Substance Use Topics  . Smoking status: Current Every Day Smoker    Packs/day: 1.00    Types: Cigarettes  . Smokeless tobacco: Never Used  . Alcohol use No        Allergies  Allergen Reactions  . Penicillins Rash    No current facility-administered medications for this encounter.          Current Outpatient Prescriptions  Medication Sig Dispense Refill  . aspirin 325 MG tablet Take 325 mg by mouth daily.    . diazepam (VALIUM) 2 MG tablet Take 1 tablet (2 mg total) by mouth once. 12 tablet 0  . lamiVUDine-zidovudine (COMBIVIR) 150-300 MG per tablet Take 1 tablet by mouth 2 (two) times daily. 56 tablet 0     Review of Systems A Multi-point review of systems was asked and  was negative except for the findings documented in the history of present illness   Physical Exam Blood pressure (!) 167/68, pulse (!) 51, temperature 98 F (36.7 C), temperature source Oral, resp. rate 18, height 5\' 4"  (1.626 m), weight 76.2 kg (168 lb), SpO2 98 %. CONSTITUTIONAL: Resting in bed in no acute distress. EYES: Pupils are equal, round, and reactive to light, Sclera are non-icteric. EARS, NOSE, MOUTH AND THROAT: The oropharynx is clear. The oral mucosa is pink and moist. Hearing is intact to voice. LYMPH NODES:  Lymph nodes in the neck are normal. RESPIRATORY:  Lungs are clear. There is normal respiratory effort, with equal breath sounds bilaterally, and without pathologic use of accessory muscles. CARDIOVASCULAR: Heart is regular without murmurs, gallops, or rubs. GI: The abdomen is soft, nontender, and nondistended. There are no palpable masses. There is no hepatosplenomegaly. There are normal bowel sounds in all quadrants. GU: Rectal deferred.   MUSCULOSKELETAL: Normal muscle strength and tone. No cyanosis or edema.   SKIN: Turgor is good and there are no pathologic skin lesions or ulcers. NEUROLOGIC: Motor and sensation is grossly normal. Cranial nerves are grossly intact. PSYCH:  Oriented to person, place and time. Affect is normal.  Data Reviewed Images and labs reviewed. Labs show mild hypokalemia of 3.3 and a mild elevation of AST to 55 but all the rest of her labs are within normal limits including a normal alkaline phosphatase  at 98, normal ALC of 44, and normal bilirubin of 0.8, normal white blood cell count of 10.1. Ultrasound was reviewed by myself. The images are of poor quality and the measured gallbladder wall thickening is from a questionable location. However, it is interpreted as having a gallbladder wall thickening with pericholecystic fluid which would be consistent with cholecystitis. I have personally reviewed the patient's imaging, laboratory findings and  medical records.    Assessment    Abdominal pain  Plan    56 year old female with a history of abdominal pain that is currently completely resolved. Imaging suggestive of acute cholecystitis but clinical exam is not. Due to this discrepancy discussed with the patient that we would attempt a by mouth challenge. If she is able to tolerate a diet and she will be discharged home with follow-up in clinic later this week to discuss further workup of biliary disease. Should she fail at a by mouth challenge then she will be admitted for hydration and likely undergo a laparoscopic cholecystectomy in the next 24-48 hours. All questions answered to the patient's satisfaction and her plan is pending whether or not she tolerates a diet.  Addendum: Patient failed PO challenge and desires admission. Will admit and plan for surgery tomorrow AM with my partner Dr. Excell Seltzer.     Time spent with the patient was 45 minutes, with more than 50% of the time spent in face-to-face education, counseling and care coordination.     Ricarda Frame, MD FACS General Surgeon 11/30/2016, 8:23 AM

## 2016-11-30 NOTE — ED Provider Notes (Signed)
Nurse went to give discharge papers and now patient is willing to stay. Notified Dr. Redgie GrayerWoodham   Abbi Mancini, MD 11/30/16 [phone]

## 2016-11-30 NOTE — Progress Notes (Signed)
  Patient had return of some pain after PO challenge.  Will bring in under observation.  Will post and consent for laparoscopic cholecystectomy tomorrow. NPO p MN Start ABX treatment of cholecystitis.  Ricarda Frameharles Haji Delaine, MD [facility] General Surgeon [facility]

## 2016-12-01 ENCOUNTER — Encounter
Admission: EM | Disposition: A | Discharge status: Home / Self Care | Payer: Self-pay | Source: Home / Self Care | Attending: Emergency Medicine

## 2016-12-01 ENCOUNTER — Encounter: Payer: Self-pay | Admitting: *Deleted

## 2016-12-01 ENCOUNTER — Observation Stay: Payer: Self-pay | Admitting: Anesthesiology

## 2016-12-01 DIAGNOSIS — K819 Cholecystitis, unspecified: Secondary | ICD-10-CM

## 2016-12-01 HISTORY — PX: CHOLECYSTECTOMY: SHX55

## 2016-12-01 SURGERY — LAPAROSCOPIC CHOLECYSTECTOMY
Anesthesia: General | Wound class: Clean Contaminated

## 2016-12-01 MED ORDER — ONDANSETRON HCL 4 MG/2ML IJ SOLN
INTRAMUSCULAR | Status: AC
  Filled 2016-12-01: qty 2

## 2016-12-01 MED ORDER — LAMIVUDINE-ZIDOVUDINE 150-300 MG PO TABS
1.0000 | ORAL_TABLET | Freq: Two times a day (BID) | ORAL | Status: DC
  Filled 2016-12-01 (×2): qty 1

## 2016-12-01 MED ORDER — MIDAZOLAM HCL 2 MG/2ML IJ SOLN
INTRAMUSCULAR | Status: AC
  Filled 2016-12-01: qty 2

## 2016-12-01 MED ORDER — KETOROLAC TROMETHAMINE 30 MG/ML IJ SOLN
INTRAMUSCULAR | Status: DC | PRN
  Administered 2016-12-01: 30 mg via INTRAVENOUS

## 2016-12-01 MED ORDER — SUCCINYLCHOLINE CHLORIDE 200 MG/10ML IV SOSY
PREFILLED_SYRINGE | INTRAVENOUS | Status: AC
  Filled 2016-12-01: qty 10

## 2016-12-01 MED ORDER — ACETAMINOPHEN 10 MG/ML IV SOLN
INTRAVENOUS | Status: AC
  Filled 2016-12-01: qty 100

## 2016-12-01 MED ORDER — SUGAMMADEX SODIUM 200 MG/2ML IV SOLN
INTRAVENOUS | Status: AC
  Filled 2016-12-01: qty 2

## 2016-12-01 MED ORDER — LAMIVUDINE 150 MG PO TABS
150.0000 mg | ORAL_TABLET | Freq: Two times a day (BID) | ORAL | Status: DC
  Filled 2016-12-01 (×3): qty 1

## 2016-12-01 MED ORDER — ROCURONIUM BROMIDE 100 MG/10ML IV SOLN
INTRAVENOUS | Status: DC | PRN
  Administered 2016-12-01: 10 mg via INTRAVENOUS
  Administered 2016-12-01: 30 mg via INTRAVENOUS

## 2016-12-01 MED ORDER — GLYCOPYRROLATE 0.2 MG/ML IJ SOLN
INTRAMUSCULAR | Status: AC
  Filled 2016-12-01: qty 1

## 2016-12-01 MED ORDER — PHENYLEPHRINE HCL 10 MG/ML IJ SOLN
INTRAMUSCULAR | Status: DC | PRN
  Administered 2016-12-01 (×3): 100 ug via INTRAVENOUS

## 2016-12-01 MED ORDER — LIDOCAINE HCL (CARDIAC) 20 MG/ML IV SOLN
INTRAVENOUS | Status: DC | PRN
Start: 2016-12-01 — End: 2016-12-01
  Administered 2016-12-01: 100 mg via INTRAVENOUS

## 2016-12-01 MED ORDER — FENTANYL CITRATE (PF) 100 MCG/2ML IJ SOLN
INTRAMUSCULAR | Status: DC | PRN
  Administered 2016-12-01: 50 ug via INTRAVENOUS
  Administered 2016-12-01: 100 ug via INTRAVENOUS

## 2016-12-01 MED ORDER — PROMETHAZINE HCL 25 MG/ML IJ SOLN: 6.2500 mg | INTRAMUSCULAR | Status: DC | PRN

## 2016-12-01 MED ORDER — MIDAZOLAM HCL 2 MG/2ML IJ SOLN
INTRAMUSCULAR | Status: DC | PRN
  Administered 2016-12-01: 2 mg via INTRAVENOUS

## 2016-12-01 MED ORDER — DEXAMETHASONE SODIUM PHOSPHATE 10 MG/ML IJ SOLN
INTRAMUSCULAR | Status: AC
  Filled 2016-12-01: qty 1

## 2016-12-01 MED ORDER — BUPIVACAINE-EPINEPHRINE (PF) 0.25% -1:200000 IJ SOLN
INTRAMUSCULAR | Status: DC | PRN
  Administered 2016-12-01: 30 mL

## 2016-12-01 MED ORDER — SUCCINYLCHOLINE CHLORIDE 20 MG/ML IJ SOLN
INTRAMUSCULAR | Status: DC | PRN
  Administered 2016-12-01: 100 mg via INTRAVENOUS

## 2016-12-01 MED ORDER — OXYCODONE-ACETAMINOPHEN 5-325 MG PO TABS: 1.0000 | ORAL_TABLET | ORAL | Status: DC | PRN

## 2016-12-01 MED ORDER — ZIDOVUDINE 100 MG PO CAPS
300.0000 mg | ORAL_CAPSULE | Freq: Two times a day (BID) | ORAL | Status: DC
  Filled 2016-12-01 (×3): qty 3

## 2016-12-01 MED ORDER — GLYCOPYRROLATE 0.2 MG/ML IJ SOLN
INTRAMUSCULAR | Status: DC | PRN
  Administered 2016-12-01: 0.2 mg via INTRAVENOUS

## 2016-12-01 MED ORDER — SUGAMMADEX SODIUM 200 MG/2ML IV SOLN
INTRAVENOUS | Status: DC | PRN
  Administered 2016-12-01: 149.6 mg via INTRAVENOUS

## 2016-12-01 MED ORDER — DEXAMETHASONE SODIUM PHOSPHATE 10 MG/ML IJ SOLN
INTRAMUSCULAR | Status: DC | PRN
  Administered 2016-12-01: 10 mg via INTRAVENOUS

## 2016-12-01 MED ORDER — ASPIRIN 325 MG PO TABS
325.0000 mg | ORAL_TABLET | Freq: Every day | ORAL | Status: DC
  Filled 2016-12-01 (×2): qty 1

## 2016-12-01 MED ORDER — PROPOFOL 10 MG/ML IV BOLUS
INTRAVENOUS | Status: AC
  Filled 2016-12-01: qty 20

## 2016-12-01 MED ORDER — ROCURONIUM BROMIDE 50 MG/5ML IV SOSY
PREFILLED_SYRINGE | INTRAVENOUS | Status: AC
  Filled 2016-12-01: qty 5

## 2016-12-01 MED ORDER — ACETAMINOPHEN 10 MG/ML IV SOLN
INTRAVENOUS | Status: DC | PRN
  Administered 2016-12-01: 1000 mg via INTRAVENOUS

## 2016-12-01 MED ORDER — KETOROLAC TROMETHAMINE 30 MG/ML IJ SOLN
INTRAMUSCULAR | Status: AC
  Filled 2016-12-01: qty 1

## 2016-12-01 MED ORDER — PROPOFOL 10 MG/ML IV BOLUS
INTRAVENOUS | Status: DC | PRN
  Administered 2016-12-01: 100 mg via INTRAVENOUS

## 2016-12-01 MED ORDER — FENTANYL CITRATE (PF) 100 MCG/2ML IJ SOLN: 25.0000 ug | INTRAMUSCULAR | Status: DC | PRN

## 2016-12-01 MED ORDER — FENTANYL CITRATE (PF) 250 MCG/5ML IJ SOLN
INTRAMUSCULAR | Status: AC
  Filled 2016-12-01: qty 5

## 2016-12-01 SURGICAL SUPPLY — 46 items
ADHESIVE MASTISOL STRL (MISCELLANEOUS) ×3 IMPLANT
APPLIER CLIP ROT 10 11.4 M/L (STAPLE) ×3
BLADE SURG SZ11 CARB STEEL (BLADE) ×3 IMPLANT
CANISTER SUCT 1200ML W/VALVE (MISCELLANEOUS) ×3 IMPLANT
CATH CHOLANGI 4FR 420404F (CATHETERS) IMPLANT
CHLORAPREP W/TINT 26ML (MISCELLANEOUS) ×3 IMPLANT
CLIP APPLIE ROT 10 11.4 M/L (STAPLE) ×1 IMPLANT
CLOSURE WOUND 1/2 X4 (GAUZE/BANDAGES/DRESSINGS) ×1
CONRAY 60ML FOR OR (MISCELLANEOUS) IMPLANT
DRAPE C-ARM XRAY 36X54 (DRAPES) IMPLANT
ELECT REM PT RETURN 9FT ADLT (ELECTROSURGICAL) ×3
ELECTRODE REM PT RTRN 9FT ADLT (ELECTROSURGICAL) ×1 IMPLANT
ENDOPOUCH RETRIEVER 10 (MISCELLANEOUS) ×3 IMPLANT
GAUZE SPONGE NON-WVN 2X2 STRL (MISCELLANEOUS) ×4 IMPLANT
GLOVE BIO SURGEON STRL SZ8 (GLOVE) ×9 IMPLANT
GLOVE BIOGEL PI IND STRL 6.5 (GLOVE) ×1 IMPLANT
GLOVE BIOGEL PI INDICATOR 6.5 (GLOVE) ×2
GLOVE INDICATOR 6.5 STRL GRN (GLOVE) ×3 IMPLANT
GOWN STRL REUS W/ TWL LRG LVL3 (GOWN DISPOSABLE) ×3 IMPLANT
GOWN STRL REUS W/TWL LRG LVL3 (GOWN DISPOSABLE) ×6
IRRIGATION STRYKERFLOW (MISCELLANEOUS) IMPLANT
IRRIGATOR STRYKERFLOW (MISCELLANEOUS)
IV CATH ANGIO 12GX3 LT BLUE (NEEDLE) IMPLANT
IV NS 1000ML (IV SOLUTION) ×2
IV NS 1000ML BAXH (IV SOLUTION) ×1 IMPLANT
JACKSON PRATT 10 (INSTRUMENTS) IMPLANT
KIT RM TURNOVER STRD PROC AR (KITS) ×3 IMPLANT
LABEL OR SOLS (LABEL) ×3 IMPLANT
NDL SAFETY 22GX1.5 (NEEDLE) ×3 IMPLANT
NEEDLE VERESS 14GA 120MM (NEEDLE) ×3 IMPLANT
NS IRRIG 500ML POUR BTL (IV SOLUTION) ×3 IMPLANT
PACK LAP CHOLECYSTECTOMY (MISCELLANEOUS) ×3 IMPLANT
SCISSORS METZENBAUM CVD 33 (INSTRUMENTS) ×3 IMPLANT
SLEEVE ENDOPATH XCEL 5M (ENDOMECHANICALS) ×6 IMPLANT
SPONGE LAP 18X18 5 PK (GAUZE/BANDAGES/DRESSINGS) IMPLANT
SPONGE VERSALON 2X2 STRL (MISCELLANEOUS) ×8
SPONGE VERSALON 4X4 4PLY (MISCELLANEOUS) IMPLANT
STRIP CLOSURE SKIN 1/2X4 (GAUZE/BANDAGES/DRESSINGS) ×2 IMPLANT
SUT MNCRL 4-0 (SUTURE) ×2
SUT MNCRL 4-0 27XMFL (SUTURE) ×1
SUT VICRYL 0 AB UR-6 (SUTURE) ×3 IMPLANT
SUTURE MNCRL 4-0 27XMF (SUTURE) ×1 IMPLANT
SYR 20CC LL (SYRINGE) ×3 IMPLANT
TROCAR XCEL NON-BLD 11X100MML (ENDOMECHANICALS) ×3 IMPLANT
TROCAR XCEL NON-BLD 5MMX100MML (ENDOMECHANICALS) ×3 IMPLANT
TUBING INSUFFLATOR HI FLOW (MISCELLANEOUS) ×3 IMPLANT

## 2016-12-01 NOTE — Progress Notes (Signed)
Patient here with acute cholecystitis scheduled for surgery this morning. Patient states her pain is better but still present and she points to the epigastrium and right upper quadrant denies nausea or vomiting  Vital signs reviewed in stable Abdomen is soft and minimally tender in the right upper quadrant with a negative Murphy sign nondistended nontympanitic calves are nontender  Records and films are reviewed. Labs are reviewed.   Recommend laparoscopic cholecystectomy for control of her symptoms the options of observation or reviewed and the risks of bleeding infection recurrence of symptoms failure to resolve her symptoms conversion to an open procedure bile duct damage bile duct leak retained common bile duct stone any of which could require further surgery and/or ERCP were reviewed with her no family was present she understood and agreed to proceed

## 2016-12-01 NOTE — Anesthesia Post-op Follow-up Note (Cosign Needed)
Anesthesia QCDR form completed.        

## 2016-12-01 NOTE — Transfer of Care (Signed)
Immediate Anesthesia Transfer of Care Note  Patient: Tiffany Shea  Procedure(s) Performed: Procedure(s): LAPAROSCOPIC CHOLECYSTECTOMY (N/A)  Patient Location: PACU  Anesthesia Type:General  Level of Consciousness: awake and sedated  Airway & Oxygen Therapy: Patient Spontanous Breathing and Patient connected to face mask oxygen  Post-op Assessment: Report given to RN and Post -op Vital signs reviewed and stable  Post vital signs: Reviewed and stable  Last Vitals:  Vitals:   12/01/16 0516 12/01/16 0809  BP: (!) 120/58 129/86  Pulse: (!) 55 (!) 50  Resp:  16  Temp:  (!) 35.6 C    Last Pain:  Vitals:   12/01/16 0809  TempSrc: Tympanic  PainSc:          Complications: No apparent anesthesia complications

## 2016-12-01 NOTE — Plan of Care (Signed)
Problem: Pain Managment: Goal: General experience of comfort will improve Outcome: Progressing Denies pain overnight; for surgery today.

## 2016-12-01 NOTE — Anesthesia Postprocedure Evaluation (Signed)
Anesthesia Post Note  Patient: Tiffany Shea  Procedure(s) Performed: Procedure(s) (LRB): LAPAROSCOPIC CHOLECYSTECTOMY (N/A)  Patient location during evaluation: PACU Anesthesia Type: General Level of consciousness: awake and alert Pain management: pain level controlled Vital Signs Assessment: post-procedure vital signs reviewed and stable Respiratory status: spontaneous breathing, nonlabored ventilation, respiratory function stable and patient connected to nasal cannula oxygen Cardiovascular status: blood pressure returned to baseline and stable Postop Assessment: no signs of nausea or vomiting Anesthetic complications: no     Last Vitals:  Vitals:   12/01/16 1118 12/01/16 1218  BP: 128/64 130/66  Pulse: 62 60  Resp: 16 16  Temp: 36.6 C 36.7 C    Last Pain:  Vitals:   12/01/16 1025  TempSrc:   PainSc: 0-No pain                 Lenard SimmerAndrew Ashantia Amaral

## 2016-12-01 NOTE — Anesthesia Procedure Notes (Signed)
Procedure Name: Intubation Date/Time: 12/01/2016 9:12 AM Performed by: Nelda Marseille Pre-anesthesia Checklist: Patient identified, Patient being monitored, Timeout performed, Emergency Drugs available and Suction available Patient Re-evaluated:Patient Re-evaluated prior to inductionOxygen Delivery Method: Circle system utilized Preoxygenation: Pre-oxygenation with 100% oxygen Intubation Type: IV induction Ventilation: Mask ventilation without difficulty Laryngoscope Size: Mac and 3 Grade View: Grade I Tube type: Oral Tube size: 7.0 mm Number of attempts: 1 Airway Equipment and Method: Stylet Placement Confirmation: ETT inserted through vocal cords under direct vision,  positive ETCO2 and breath sounds checked- equal and bilateral Secured at: 21 cm Tube secured with: Tape Dental Injury: Teeth and Oropharynx as per pre-operative assessment

## 2016-12-01 NOTE — Anesthesia Preprocedure Evaluation (Signed)
Anesthesia Evaluation  Patient identified by MRN, date of birth, ID band Patient awake    Reviewed: Allergy & Precautions, H&P , NPO status , Patient's Chart, lab work & pertinent test results, reviewed documented beta blocker date and time   History of Anesthesia Complications Negative for: history of anesthetic complications  Airway Mallampati: III  TM Distance: >3 FB Neck ROM: full    Dental  (+) Caps, Teeth Intact, Dental Advidsory Given   Pulmonary neg shortness of breath, neg sleep apnea, neg COPD, Recent URI , Current Smoker,           Cardiovascular Exercise Tolerance: Good negative cardio ROS       Neuro/Psych negative neurological ROS  negative psych ROS   GI/Hepatic Neg liver ROS, GERD  ,  Endo/Other  negative endocrine ROS  Renal/GU negative Renal ROS  negative genitourinary   Musculoskeletal   Abdominal   Peds  Hematology negative hematology ROS (+)   Anesthesia Other Findings History reviewed. No pertinent past medical history.   Reproductive/Obstetrics negative OB ROS                             Anesthesia Physical Anesthesia Plan  ASA: II  Anesthesia Plan: General   Post-op Pain Management:    Induction:   Airway Management Planned:   Additional Equipment:   Intra-op Plan:   Post-operative Plan:   Informed Consent: I have reviewed the patients History and Physical, chart, labs and discussed the procedure including the risks, benefits and alternatives for the proposed anesthesia with the patient or authorized representative who has indicated his/her understanding and acceptance.   Dental Advisory Given  Plan Discussed with: Anesthesiologist, CRNA and Surgeon  Anesthesia Plan Comments:         Anesthesia Quick Evaluation

## 2016-12-01 NOTE — Op Note (Signed)
Laparoscopic Cholecystectomy  Pre-operative Diagnosis: Acute cholecystitis  Post-operative Diagnosis: Acute cholecystitis  Procedure: Laparoscopic cholecystectomy  Surgeon: Adah Salvage. Excell Seltzer, MD FACS  Anesthesia: Gen. with endotracheal tube  Assistant: PA student  Procedure Details  The patient was seen again in the Holding Room. The benefits, complications, treatment options, and expected outcomes were discussed with the patient. The risks of bleeding, infection, recurrence of symptoms, failure to resolve symptoms, bile duct damage, bile duct leak, retained common bile duct stone, bowel injury, any of which could require further surgery and/or ERCP, stent, or papillotomy were reviewed with the patient. The likelihood of improving the patient's symptoms with return to their baseline status is good.  The patient and/or family concurred with the proposed plan, giving informed consent.  The patient was taken to Operating Room, identified as Tiffany Shea and the procedure verified as Laparoscopic Cholecystectomy.  A Time Out was held and the above information confirmed.  Prior to the induction of general anesthesia, antibiotic prophylaxis was administered. VTE prophylaxis was in place. General endotracheal anesthesia was then administered and tolerated well. After the induction, the abdomen was prepped with Chloraprep and draped in the sterile fashion. The patient was positioned in the supine position.  Local anesthetic  was injected into the skin near the umbilicus and an incision made. The Veress needle was placed. Pneumoperitoneum was then created with CO2 and tolerated well without any adverse changes in the patient's vital signs. A 5mm port was placed in the periumbilical position and the abdominal cavity was explored.  Two 5-mm ports were placed in the right upper quadrant and a 12 mm epigastric port was placed all under direct vision. All skin incisions  were infiltrated with a local anesthetic  agent before making the incision and placing the trocars.   The patient was positioned  in reverse Trendelenburg, tilted slightly to the patient's left.  The gallbladder was identified, the fundus grasped and retracted cephalad. Adhesions were lysed bluntly. The infundibulum was grasped and retracted laterally, exposing the peritoneum overlying the triangle of Calot. This was then divided and exposed in a blunt fashion. A critical view of the cystic duct and cystic artery was obtained.  The cystic duct was clearly identified and bluntly dissected.   The cystic duct was doubly clipped and divided where it entered the infundibulum of the gallbladder. There was a palpable stone just above this entry point. The cystic artery was then doubly clipped and divided as well. Another branch of the cystic artery was doubly clipped and divided.  The gallbladder was taken from the gallbladder fossa in a retrograde fashion with the electrocautery. The gallbladder was removed and placed in an Endocatch bag. The liver bed was irrigated and inspected. Hemostasis was achieved with the electrocautery. Copious irrigation was utilized and was repeatedly aspirated until clear.  The gallbladder and Endocatch sac were then removed through the epigastric port site.   Inspection of the right upper quadrant was performed. No bleeding, bile duct injury or leak, or bowel injury was noted. Pneumoperitoneum was released.  The epigastric port site was closed with figure-of-eight 0 Vicryl sutures. 4-0 subcuticular Monocryl was used to close the skin. Steristrips and Mastisol and sterile dressings were  applied.  The patient was then extubated and brought to the recovery room in stable condition. Sponge, lap, and needle counts were correct at closure and at the conclusion of the case.   Findings: Acute Cholecystitis   Estimated Blood Loss: Minimal  Drains: None         Specimens: Gallbladder           Complications: none                Richard E. Excell Seltzerooper, MD, FACS

## 2016-12-02 LAB — CBC WITH DIFFERENTIAL/PLATELET
BASOS PCT: 0 %
Basophils Absolute: 0 10*3/uL (ref 0–0.1)
Eosinophils Absolute: 0 10*3/uL (ref 0–0.7)
Eosinophils Relative: 0 %
HEMATOCRIT: 35 % (ref 35.0–47.0)
Hemoglobin: 12.3 g/dL (ref 12.0–16.0)
Lymphocytes Relative: 20 %
Lymphs Abs: 1.9 10*3/uL (ref 1.0–3.6)
MCH: 32.2 pg (ref 26.0–34.0)
MCHC: 35 g/dL (ref 32.0–36.0)
MCV: 92 fL (ref 80.0–100.0)
MONOS PCT: 10 %
Monocytes Absolute: 0.9 10*3/uL (ref 0.2–0.9)
NEUTROS ABS: 6.7 10*3/uL — AB (ref 1.4–6.5)
Neutrophils Relative %: 70 %
Platelets: 154 10*3/uL (ref 150–440)
RBC: 3.8 MIL/uL (ref 3.80–5.20)
RDW: 13.3 % (ref 11.5–14.5)
WBC: 9.5 10*3/uL (ref 3.6–11.0)

## 2016-12-02 LAB — COMPREHENSIVE METABOLIC PANEL
ALBUMIN: 3.2 g/dL — AB (ref 3.5–5.0)
ALT: 51 U/L (ref 14–54)
ANION GAP: 5 (ref 5–15)
AST: 58 U/L — ABNORMAL HIGH (ref 15–41)
Alkaline Phosphatase: 81 U/L (ref 38–126)
BILIRUBIN TOTAL: 0.5 mg/dL (ref 0.3–1.2)
BUN: 5 mg/dL — ABNORMAL LOW (ref 6–20)
CO2: 26 mmol/L (ref 22–32)
Calcium: 8.5 mg/dL — ABNORMAL LOW (ref 8.9–10.3)
Chloride: 108 mmol/L (ref 101–111)
Creatinine, Ser: 0.66 mg/dL (ref 0.44–1.00)
Glucose, Bld: 138 mg/dL — ABNORMAL HIGH (ref 65–99)
POTASSIUM: 3.9 mmol/L (ref 3.5–5.1)
Sodium: 139 mmol/L (ref 135–145)
TOTAL PROTEIN: 5.9 g/dL — AB (ref 6.5–8.1)

## 2016-12-02 LAB — SURGICAL PATHOLOGY

## 2016-12-02 MED ORDER — OXYCODONE-ACETAMINOPHEN 5-325 MG PO TABS: 1.0000 | ORAL_TABLET | ORAL | 0 refills | Status: DC | PRN

## 2016-12-02 MED ORDER — CIPROFLOXACIN HCL 500 MG PO TABS: 500.0000 mg | ORAL_TABLET | Freq: Two times a day (BID) | ORAL | 1 refills | Status: DC

## 2016-12-02 NOTE — Progress Notes (Signed)
1 Day Post-Op  Subjective: Status post laparoscopic cholecystectomy for acute cholecystitis. Patient feels much better today has some incisional pain but is hungry and wants to eat.  Objective: Vital signs in last 24 hours: Temp:  [97.4 F (36.3 C)-98.4 F (36.9 C)] 98.4 F (36.9 C) (01/30 0426) Pulse Rate:  [48-95] 48 (01/30 0426) Resp:  [10-19] 16 (01/30 0426) BP: (95-140)/(50-78) 113/50 (01/30 0426) SpO2:  [93 %-98 %] 97 % (01/30 0426) Last BM Date: 12/02/16  Intake/Output from previous day: 01/29 0701 - 01/30 0700 In: 4563.8 [P.O.:1880; I.V.:2483.8; IV Piggyback:200] Out: 1705 [Urine:1700; Blood:5] Intake/Output this shift: No intake/output data recorded.  Physical exam:  No icterus no jaundice abdomen is soft and minimally distended nontender wounds are dressed. Calves are nontender  Lab Results: CBC   Recent Labs  11/30/16 0459 12/02/16 0442  WBC 10.1 9.5  HGB 13.9 12.3  HCT 39.2 35.0  PLT 172 154   BMET  Recent Labs  11/30/16 0459 12/02/16 0442  NA 138 139  K 3.3* 3.9  CL 104 108  CO2 24 26  GLUCOSE 186* 138*  BUN 9 5*  CREATININE 0.74 0.66  CALCIUM 8.4* 8.5*   PT/INR No results for input(s): LABPROT, INR in the last 72 hours. ABG No results for input(s): PHART, HCO3 in the last 72 hours.  Invalid input(s): PCO2, PO2  Studies/Results: No results found.  Anti-infectives: Anti-infectives    Start     Dose/Rate Route Frequency Ordered Stop   12/02/16 0000  ciprofloxacin (CIPRO) 500 MG tablet     500 mg Oral 2 times daily 12/02/16 0731     12/01/16 1500  lamiVUDine (EPIVIR) tablet 150 mg  Status:  Discontinued     150 mg Oral 2 times daily 12/01/16 1411 12/02/16 0819   12/01/16 1500  zidovudine (RETROVIR) capsule 300 mg  Status:  Discontinued     300 mg Oral 2 times daily 12/01/16 1411 12/02/16 0819   12/01/16 1045  lamiVUDine-zidovudine (COMBIVIR) 150-300 MG per tablet 1 tablet  Status:  Discontinued     1 tablet Oral 2 times daily  12/01/16 1043 12/01/16 1411   11/30/16 1130  ciprofloxacin (CIPRO) IVPB 400 mg     400 mg 200 mL/hr over 60 Minutes Intravenous Every 12 hours 11/30/16 1122        Assessment/Plan: s/p Procedure(s): LAPAROSCOPIC CHOLECYSTECTOMY   LFT was reviewed and normal. We will advance diet and discharged today to follow-up in 10 days.  Lattie Hawichard E Nickolis Diel, MD, FACS  12/02/2016

## 2016-12-02 NOTE — Discharge Instructions (Signed)
Remove dressing in 24 hours. °May shower in 24 hours. °Leave paper strips in place. °Resume all home medications. °Follow-up with Dr. Burke Terry in 10 days. °

## 2016-12-02 NOTE — Care Management (Signed)
Patient discharged prior to assessment.  Patient self pay.  No PCP listed. [facility], and pain medication.

## 2016-12-02 NOTE — Discharge Summary (Signed)
Physician Discharge Summary  Patient ID: Tiffany DurieMary Shea MRN: [id number] DOB/AGE: 1961-08-14 56 y.o.  Admit date: 11/30/2016 Discharge date: 12/02/2016   Discharge Diagnoses:  Active Problems:   Cholecystitis   Procedures:Laparoscopic cholecystectomy  Hospital Course: This patient with symptomatic cholelithiasis and findings of acute cholecystitis. She was taken the operating room where laparoscopic cholecystectomy identified acute cholecystitis with gallstones. She made a non-, K to postoperative recovery and is discharged in stable condition to follow-up in our office in 10 days she is given oral analgesics for pain and she will follow-up with instructions to remove her dressing tomorrow and shower tomorrow.  Consults: None  Disposition: 01-Home or Self Care   Allergies as of 12/02/2016      Reactions   Penicillins Rash      Medication List    TAKE these medications   aspirin 325 MG tablet Take 325 mg by mouth daily.   ciprofloxacin 500 MG tablet Commonly known as:  CIPRO Take 1 tablet (500 mg total) by mouth 2 (two) times daily.   diazepam 2 MG tablet Commonly known as:  VALIUM Take 1 tablet (2 mg total) by mouth once.   lamiVUDine-zidovudine 150-300 MG tablet Commonly known as:  COMBIVIR Take 1 tablet by mouth 2 (two) times daily.   oxyCODONE-acetaminophen 5-325 MG tablet Commonly known as:  ROXICET Take 1 tablet by mouth every 6 (six) hours as needed.   oxyCODONE-acetaminophen 5-325 MG tablet Commonly known as:  PERCOCET/ROXICET Take 1 tablet by mouth every 4 (four) hours as needed for moderate pain.      Follow-up Information    Ricarda Frameharles Woodham, MD Follow up.   Specialty:  General Surgery Contact information: [address] [place] [address] [phone]        Dionne Miloichard Graden Hoshino, MD Follow up in 2 week(s).   Specialty:  Surgery Contact information: [address] [place] [address] [phone]           Lattie Hawichard E  Loria Lacina, MD, FACS

## 2016-12-16 ENCOUNTER — Encounter: Payer: Self-pay | Admitting: General Surgery

## 2016-12-18 ENCOUNTER — Ambulatory Visit (INDEPENDENT_AMBULATORY_CARE_PROVIDER_SITE_OTHER): Payer: Self-pay | Admitting: General Surgery

## 2016-12-18 ENCOUNTER — Encounter: Payer: Self-pay | Admitting: General Surgery

## 2016-12-18 VITALS — BP 110/76 | HR 93 | Temp 97.7°F | Ht 64.0 in | Wt 162.6 lb

## 2016-12-18 DIAGNOSIS — Z4889 Encounter for other specified surgical aftercare: Secondary | ICD-10-CM

## 2016-12-18 MED ORDER — FLUCONAZOLE 200 MG PO TABS: 200.0000 mg | ORAL_TABLET | Freq: Every day | ORAL | 0 refills | Status: AC

## 2016-12-18 NOTE — Patient Instructions (Signed)
Please pick up your prescription for your Diflucan at the [facility] on [address]  Please look over the list of Primary Care Clinics that we have given you and call for an appointment to establish care.  Please call our office with any questions or concerns.  Please do not submerge in a tub, hot tub, or pool until incisions are completely sealed.  Use sun block to incision area over the next year if this area will be exposed to sun. This helps decrease scarring.  You may resume your normal activities on 01/12/17. At that time- Listen to your body when lifting, if you have pain when lifting, stop and then try again in a few days. Pain after doing exercises or activities of daily living is normal as you get back in to your normal routine.  If you develop redness, drainage, or pain at incision sites- call our office immediately and speak with a nurse.

## 2016-12-18 NOTE — Progress Notes (Signed)
Outpatient Surgical Follow Up  12/18/2016  Tiffany Shea is an 56 y.o. female.   Chief Complaint  Patient presents with  . Routine Post Op    Laparoscopic Cholecystectomy (12/01/16)- Dr. Excell Seltzerooper    HPI: 56 year old female returns to clinic 2 weeks status post laparoscopic cholecystectomy. Patient reports that her pain is much better and she is known to eat better since surgery. She denies any fevers, chills, nausea, vomiting, chest pain, shortness of breath, diarrhea, constipation. She does however report some itching in her groin consistent with a yeast infection. She has been on antibiotics secondary to her cholecystitis.  Past Medical History:  Diagnosis Date  . Cholecystitis     Past Surgical History:  Procedure Laterality Date  . CHOLECYSTECTOMY N/A 12/01/2016   Procedure: LAPAROSCOPIC CHOLECYSTECTOMY;  Surgeon: Lattie Hawichard E Cooper, MD;  Location: [facility] ORS;  Service: General;  Laterality: N/A;  . FEMUR FRACTURE SURGERY Bilateral 1972  . PELVIC FRACTURE SURGERY  1972  . TONSILLECTOMY  1968    Family History  Problem Relation Age of Onset  . Heart disease Mother   . Lung cancer Father     Social History:  reports that she has been smoking Cigarettes.  She has been smoking about 1.00 pack per day. She has never used smokeless tobacco. She reports that she does not drink alcohol or use drugs.  Allergies:  Allergies  Allergen Reactions  . Penicillins Rash    Medications reviewed.    ROS A multipoint review of systems was completed. All pertinent positives and negatives are documented within the history of present illness and remainder are negative   BP 110/76   Pulse 93   Temp 97.7 F (36.5 C) (Oral)   Ht 5\' 4"  (1.626 m)   Wt 73.8 kg (162 lb 9.6 oz)   BMI 27.91 kg/m   Physical Exam Gen.: No acute distress Chest: Clear to auscultation Heart: Regular rate and rhythm Abdomen: Soft, nontender, nondistended. Well approximated laparoscopic incision sites on evidence of  erythema or drainage.    No results found for this or any previous visit (from the past 48 hour(s)). No results found.  Assessment/Plan:  1. Aftercare following surgery 56 year old female status post laparoscopic cholecystectomy. Pathology reviewed with the patient. Provided with standard postoperative precautions and activities. She voiced understanding. Given her reported [place] infection she'll be provided with a prescription for Diflucan. Recommended that she find a primary care provider to follow up with. She will follow up with us on an as-needed basis.     Ricarda Frameharles Quantarius Genrich, MD FACS General Surgeon  12/18/2016,2:56 PM

## 2018-02-25 ENCOUNTER — Ambulatory Visit: Payer: Self-pay

## 2018-03-04 ENCOUNTER — Ambulatory Visit: Payer: Self-pay | Admitting: Family Medicine

## 2018-03-04 VITALS — BP 120/76 | HR 66 | Temp 96.1°F | Wt 159.5 lb

## 2018-03-04 DIAGNOSIS — F329 Major depressive disorder, single episode, unspecified: Secondary | ICD-10-CM

## 2018-03-04 DIAGNOSIS — R748 Abnormal levels of other serum enzymes: Secondary | ICD-10-CM

## 2018-03-04 DIAGNOSIS — Z7689 Persons encountering health services in other specified circumstances: Secondary | ICD-10-CM

## 2018-03-04 DIAGNOSIS — R739 Hyperglycemia, unspecified: Secondary | ICD-10-CM

## 2018-03-04 DIAGNOSIS — F17200 Nicotine dependence, unspecified, uncomplicated: Secondary | ICD-10-CM

## 2018-03-04 DIAGNOSIS — IMO0001 Reserved for inherently not codable concepts without codable children: Secondary | ICD-10-CM

## 2018-03-04 DIAGNOSIS — Z87440 Personal history of urinary (tract) infections: Secondary | ICD-10-CM

## 2018-03-04 DIAGNOSIS — Z0000 Encounter for general adult medical examination without abnormal findings: Secondary | ICD-10-CM

## 2018-03-04 DIAGNOSIS — M25552 Pain in left hip: Secondary | ICD-10-CM

## 2018-03-04 DIAGNOSIS — Z09 Encounter for follow-up examination after completed treatment for conditions other than malignant neoplasm: Secondary | ICD-10-CM

## 2018-03-04 DIAGNOSIS — F32A Depression, unspecified: Secondary | ICD-10-CM

## 2018-03-04 NOTE — Progress Notes (Signed)
Patient: Tiffany Shea Female    DOB: June 20, 1961   57 y.o.   MRN: [id number] Visit Date: 03/04/2018  Today's Provider: Kallie Locks, FNP   Chief Complaint  Patient presents with  . Establish Care    depression, hip pain   Subjective:   HPI   Patient is here today to establish care.   S/p: Cholecystectomy 12/01/2017. She states that she is doing well after surgery with no complaints. She states that she hasn't had regular follow ups with physicians. States that she only went to a doctor when the really felt bad. He has history of Tonsillectomy and Pelvic/Femur fracture surgery when she was very young. She states that she has not had any other surgeries.   She states that she is having some mild depression. She states that her mother had history of depression/anxiety. She is not sure of any other family history of health care diseases.   She states that she currently smokes 1/2-1 pack/day. States that she has been smoking since she was a teenager. She states that she has occasional cough. She has desire to quit smoking and is inquiring about Chantix. She denies chest pain, and shortness of breath.   She has a history of UTIs. Denies fevers, chills, unintentional weight loss, recent infections, and night sweats.   She states that she has occasional headaches. She states that she does have mild pain in her left hip, for a few days. She states that it is r/t straining on her job. She states that she occasionally has spasms/pain in left leg/hip/thigh area. She states that she has occasional back pain. Occasional numbness/tingling in fingers and toes. Denies headaches, dizziness, and visual changes.   She has never had Colonoscopy or Mammogram. She has a good appetite. She denies abdominal pain, nausea, vomiting, diarrhea, and constipation. Denies any incidents of bleeding. She denies any changes and pain in breasts.   She states that she has 2 grown children, sons. She lives with her husband and  son.   She works 3rd shift.    Allergies  Allergen Reactions  . Penicillins Rash   Previous Medications   No medications on file   Review of Systems  Constitutional: Negative.   HENT: Negative.   Eyes: Negative.   Respiratory: Positive for cough.        Occasional cough; r/t smoking  Cardiovascular: Negative.   Gastrointestinal: Negative.   Endocrine: Negative.   Musculoskeletal: Positive for arthralgias.       Generalized  Skin: Negative.   Allergic/Immunologic: Negative.   Neurological: Positive for headaches.       Occasional  Hematological: Negative.   Psychiatric/Behavioral:       Depression/Anxiety   Social History   Tobacco Use  . Smoking status: Current Every Day Smoker    Packs/day: 1.00    Years: 30.00    Pack years: 30.00    Types: Cigarettes  . Smokeless tobacco: Never Used  Substance Use Topics  . Alcohol use: No   Objective:   BP 120/76   Pulse 66   Temp (!) 96.1 F (35.6 C)   Wt 159 lb 8 oz (72.3 kg)   BMI 27.38 kg/m   Physical Exam  Constitutional: She is oriented to person, place, and time. She appears well-developed and well-nourished.  HENT:  Head: Normocephalic.  Right Ear: External ear normal.  Left Ear: External ear normal.  Nose: Nose normal.  Mouth/Throat: Oropharynx is clear and moist.  Eyes: Pupils are equal, round,  and reactive to light. Conjunctivae and EOM are normal.  Neck: Normal range of motion. Neck supple.  Cardiovascular: Normal rate, regular rhythm, normal heart sounds and intact distal pulses.  Pulmonary/Chest: Effort normal and breath sounds normal.  Abdominal: Soft. Bowel sounds are normal.  Musculoskeletal: Normal range of motion.  Neurological: She is alert and oriented to person, place, and time.  Skin: Skin is warm and dry. Capillary refill takes less than 2 seconds.  Psychiatric: She has a normal mood and affect. Her behavior is normal. Judgment and thought content normal.  Vitals reviewed.  Assessment  & Plan:   1. Hospital discharge follow-up She is s/p: cholecystectomy 11/2017. She is doing well post surgery. We will plan for Colonoscopy and Screening Mammogram in the near future. We will draw labs today and re-assess in 1 week.   2. Encounter to establish care She is not having any acute issues. We will draw labs today and re-assess at next OV.  3. Health care maintenance - CBC w/Diff - TSH - Lipid Profile - Comprehensive metabolic panel; Future  4. Smoking Patient is inquiring about initiating Chantix to help with quitting smoking.   5. Depression, unspecified depression type Stable, with no acute issues.  No suicidal ideations noted. We will possibly arrange for her to meet with Herbert Seta at our office for referral to a mental health counselor for initiation of anti-depressant medications.   6. Pain of left hip joint Stable, mild hip pain, which she r/t to work. She states that pain is getting better.   7. History of UTI - Urinalysis, Routine w reflex microscopic  8. Hyperglycemia Last Glucose level was increased at 138 over a year ago on 12/02/2016. We will draw labs today. Monitor.   9. Increased liver enzymes Last AST level was increased at 58 over a year ago on 12/02/2016. Labs drawn today. Monitor.   10. Follow up She will follow up in 1 week to review labs, and continue with recommendations.     Kallie Locks, FNP   [facility]

## 2018-03-05 LAB — COMPREHENSIVE METABOLIC PANEL
ALT: 13 IU/L (ref 0–32)
AST: 15 IU/L (ref 0–40)
Albumin/Globulin Ratio: 1.7 (ref 1.2–2.2)
Albumin: 4.3 g/dL (ref 3.5–5.5)
Alkaline Phosphatase: 125 IU/L — ABNORMAL HIGH (ref 39–117)
BUN/Creatinine Ratio: 13 (ref 9–23)
BUN: 9 mg/dL (ref 6–24)
Bilirubin Total: <0.2 mg/dL (ref 0.0–1.2)
CO2: 25 mmol/L (ref 20–29)
Calcium: 9.4 mg/dL (ref 8.7–10.2)
Chloride: 104 mmol/L (ref 96–106)
Creatinine, Ser: 0.72 mg/dL (ref 0.57–1.00)
GFR calc Af Amer: 107 mL/min/{1.73_m2} (ref >59)
GFR calc non Af Amer: 93 mL/min/{1.73_m2} (ref >59)
Globulin, Total: 2.6 g/dL (ref 1.5–4.5)
Glucose: 96 mg/dL (ref 65–99)
Potassium: 5.1 mmol/L (ref 3.5–5.2)
Sodium: 144 mmol/L (ref 134–144)
Total Protein: 6.9 g/dL (ref 6.0–8.5)

## 2018-03-05 LAB — CBC WITH DIFFERENTIAL/PLATELET
Basophils Absolute: 0 10*3/uL (ref 0.0–0.2)
Basos: 1 %
EOS (ABSOLUTE): 0.1 10*3/uL (ref 0.0–0.4)
Eos: 1 %
Hematocrit: 43.2 % (ref 34.0–46.6)
Hemoglobin: 14.6 g/dL (ref 11.1–15.9)
Immature Grans (Abs): 0 10*3/uL (ref 0.0–0.1)
Immature Granulocytes: 0 %
Lymphocytes Absolute: 3.3 10*3/uL — ABNORMAL HIGH (ref 0.7–3.1)
Lymphs: 43 %
MCH: 31.8 pg (ref 26.6–33.0)
MCHC: 33.8 g/dL (ref 31.5–35.7)
MCV: 94 fL (ref 79–97)
Monocytes Absolute: 0.5 10*3/uL (ref 0.1–0.9)
Monocytes: 7 %
Neutrophils Absolute: 3.7 10*3/uL (ref 1.4–7.0)
Neutrophils: 48 %
Platelets: 187 10*3/uL (ref 150–379)
RBC: 4.59 x10E6/uL (ref 3.77–5.28)
RDW: 14 % (ref 12.3–15.4)
WBC: 7.7 10*3/uL (ref 3.4–10.8)

## 2018-03-05 LAB — MICROSCOPIC EXAMINATION: Casts: NONE SEEN /lpf

## 2018-03-05 LAB — URINALYSIS, ROUTINE W REFLEX MICROSCOPIC
Bilirubin, UA: NEGATIVE
Glucose, UA: NEGATIVE
Ketones, UA: NEGATIVE
Nitrite, UA: NEGATIVE
Protein, UA: NEGATIVE
RBC, UA: NEGATIVE
Specific Gravity, UA: 1.006 (ref 1.005–1.030)
Urobilinogen, Ur: 0.2 mg/dL (ref 0.2–1.0)
pH, UA: 6 (ref 5.0–7.5)

## 2018-03-05 LAB — TSH: TSH: 0.924 u[IU]/mL (ref 0.450–4.500)

## 2018-03-05 LAB — LIPID PANEL
Chol/HDL Ratio: 6.6 ratio — ABNORMAL HIGH (ref 0.0–4.4)
Cholesterol, Total: 224 mg/dL — ABNORMAL HIGH (ref 100–199)
HDL: 34 mg/dL — ABNORMAL LOW (ref >39)
LDL Calculated: 110 mg/dL — ABNORMAL HIGH (ref 0–99)
Triglycerides: 398 mg/dL — ABNORMAL HIGH (ref 0–149)
VLDL Cholesterol Cal: 80 mg/dL — ABNORMAL HIGH (ref 5–40)

## 2018-03-11 ENCOUNTER — Ambulatory Visit: Payer: Self-pay | Admitting: Family Medicine

## 2018-03-11 VITALS — BP 113/73 | HR 81 | Temp 97.7°F | Wt 160.9 lb

## 2018-03-11 DIAGNOSIS — E785 Hyperlipidemia, unspecified: Secondary | ICD-10-CM

## 2018-03-11 DIAGNOSIS — Z09 Encounter for follow-up examination after completed treatment for conditions other than malignant neoplasm: Secondary | ICD-10-CM

## 2018-03-11 DIAGNOSIS — IMO0001 Reserved for inherently not codable concepts without codable children: Secondary | ICD-10-CM

## 2018-03-11 DIAGNOSIS — Z716 Tobacco abuse counseling: Secondary | ICD-10-CM

## 2018-03-11 DIAGNOSIS — F17200 Nicotine dependence, unspecified, uncomplicated: Secondary | ICD-10-CM

## 2018-03-11 MED ORDER — VARENICLINE TARTRATE 1 MG PO TABS: ORAL_TABLET | ORAL | 1 refills | Status: DC

## 2018-03-11 NOTE — Progress Notes (Signed)
  Patient: Tiffany Shea Female    DOB: 1961-09-04   57 y.o.   MRN: [id number] Visit Date: 03/11/2018  Today's Provider: Kallie Locks, FNP   Chief Complaint  Patient presents with  . Depression  . Nicotine Dependence   Subjective:   HPI   She states that she is feeling well with no complaints. She does have alternate diarrhea and constipation. She denies abdominal pain, nausea, vomiting, blood in stools. She denies any other incidents of bleeding. She states that her appetite is good.   She denies fevers, chills, recent infections, unintentional weight loss, and night sweats. She denies dizziness, visual changes, and falls.   She states that she is not having and pain today. She denies neuropathy.   She states that she continues to have a desire to quit smoking.   Allergies  Allergen Reactions  . Peanut (Diagnostic) Hives  . Penicillins Rash   Previous Medications   No medications on file    Review of Systems  Constitutional: Negative.   HENT: Negative.   Eyes: Negative.   Respiratory: Negative.   Cardiovascular: Negative.   Gastrointestinal: Negative.   Endocrine: Negative.   Genitourinary: Negative.   Musculoskeletal: Negative.   Skin: Negative.   Allergic/Immunologic: Negative.   Neurological: Negative.   Hematological: Negative.   Psychiatric/Behavioral: Negative.     Social History   Tobacco Use  . Smoking status: Current Every Day Smoker    Packs/day: 1.00    Years: 30.00    Pack years: 30.00    Types: Cigarettes  . Smokeless tobacco: Never Used  Substance Use Topics  . Alcohol use: No   Objective:   BP 113/73   Pulse 81   Temp 97.7 F (36.5 C)   Wt 160 lb 14.4 oz (73 kg)   BMI 27.62 kg/m   Physical Exam  Constitutional: She is oriented to person, place, and time. She appears well-developed and well-nourished.  HENT:  Head: Normocephalic and atraumatic.  Eyes: Pupils are equal, round, and reactive to light. Conjunctivae and EOM are normal.   Neck: Normal range of motion. Neck supple.  Cardiovascular: Normal rate, regular rhythm, normal heart sounds and intact distal pulses.  Abdominal: Soft. Bowel sounds are normal.  Musculoskeletal: Normal range of motion.  Neurological: She is alert and oriented to person, place, and time.  Skin: Skin is warm and dry. Capillary refill takes less than 2 seconds.  Psychiatric: She has a normal mood and affect. Her behavior is normal. Thought content normal.  Nursing note and vitals reviewed.      Assessment & Plan:   1. Smoking Her labs are stable. We will initiate Chanitx. She will begin with Chantix starter package. We will evaluate effectiveness in 2 months. She will report to office if she experiences any worrisome concerns. Monitor.   2. Encounter for smoking cessation counseling See above.  3. Elevated lipids Most recent Lipid panel revealed Cholesterol level elevated at 224, Triglycerides elevated at 398, HDL decreased at 34, and LDL elevated at 110. She is currently not on any medications at this time. She is eating healthier, decreasing her sweet tea intake, and drinking more water. We will continue to monitor lipid panel with possible plan of initiating lipid-lowering medication if levels do not improve.    4. Follow up She will follow up in 2 months to assess effectiveness of Chantix.  Kallie Locks, FNP   [facility]

## 2018-03-12 ENCOUNTER — Encounter: Payer: Self-pay | Admitting: Family Medicine

## 2018-05-13 ENCOUNTER — Ambulatory Visit: Payer: Self-pay | Admitting: Family Medicine

## 2018-05-13 VITALS — BP 105/67 | HR 68 | Temp 98.2°F | Wt 166.0 lb

## 2018-05-13 DIAGNOSIS — E785 Hyperlipidemia, unspecified: Secondary | ICD-10-CM

## 2018-05-13 DIAGNOSIS — Z09 Encounter for follow-up examination after completed treatment for conditions other than malignant neoplasm: Secondary | ICD-10-CM

## 2018-05-13 DIAGNOSIS — F17200 Nicotine dependence, unspecified, uncomplicated: Secondary | ICD-10-CM

## 2018-05-13 DIAGNOSIS — IMO0001 Reserved for inherently not codable concepts without codable children: Secondary | ICD-10-CM

## 2018-05-13 NOTE — Progress Notes (Signed)
Patient: Tiffany Shea Female    DOB: April 08, 1961   56 y.o.   MRN: [id number] Visit Date: 05/13/2018  Today's Provider: Kallie Locks, FNP   Chief Complaint  Patient presents with  . Follow-up    no complaints   Subjective:   HPI   Tiffany Shea is a 57 yo F here for f/u to evaluate effectiveness of Chantix. She states that she is still smoking 1 pack/day. She states she is going to try to alternate days of smoking and she is still on the fence for starting Chantix due to listed side effects.   She states that she is feeling well with no complaints. She denies abdominal pain, nausea, vomiting, blood in stools. She states that her appetite is good and she is trying to eat healthy.   She endorses hot flashes.   She denies fevers, chills, recent infections, unintentional weight loss, and night sweats. She denies dizziness, visual changes, and falls.   She states that she is not having and pain today. She denies neuropathy.   She denies SOB. She denies coughing.   She endorses a continued desire to quit smoking.   Allergies  Allergen Reactions  . Peanut (Diagnostic) Hives  . Penicillins Rash   Previous Medications   VARENICLINE (CHANTIX) 1 MG TABLET    Give starter package.    Review of Systems  Constitutional: Negative.   HENT: Negative.   Eyes: Negative.   Respiratory: Negative.   Cardiovascular: Negative.   Gastrointestinal: Negative.   Endocrine: Negative.   Genitourinary: Negative.   Musculoskeletal: Negative.   Skin: Negative.   Allergic/Immunologic: Negative.   Neurological: Negative.   Hematological: Negative.   Psychiatric/Behavioral: Negative.     Social History   Tobacco Use  . Smoking status: Current Every Day Smoker    Packs/day: 1.00    Years: 30.00    Pack years: 30.00    Types: Cigarettes  . Smokeless tobacco: Never Used  Substance Use Topics  . Alcohol use: No   Objective:   BP 105/67 (Cuff Size: Large) Comment: Cuff was too large for her  arm - reading likely too low.  Pulse 68   Temp 98.2 F (36.8 C)   Wt 166 lb (75.3 kg)   BMI 28.49 kg/m   Physical Exam  Constitutional: She is oriented to person, place, and time. She appears well-developed and well-nourished.  HENT:  Head: Normocephalic and atraumatic.  Eyes: Pupils are equal, round, and reactive to light. Conjunctivae and EOM are normal.  Neck: Normal range of motion. Neck supple.  Cardiovascular: Normal rate, regular rhythm, normal heart sounds and intact distal pulses.  Abdominal: Soft. Bowel sounds are normal.  Musculoskeletal: Normal range of motion.  Neurological: She is alert and oriented to person, place, and time.  Skin: Skin is warm and dry. Capillary refill takes less than 2 seconds.  Psychiatric: She has a normal mood and affect. Her behavior is normal. Thought content normal.  Nursing note and vitals reviewed.      Assessment & Plan:   1. Smoking She will begin with Chantix starter package. We will evaluate effectiveness in 2 months. She will report to office if she experiences any worrisome concerns. Monitor.   2. Encounter for smoking cessation counseling See above.  3. Elevated lipids Most recent Lipid panel revealed Cholesterol level elevated at 224, Triglycerides elevated at 398, HDL decreased at 34, and LDL elevated at 110. She is currently not on any medications at this time. She is  eating healthier, decreasing her sweet tea intake, and drinking more water. We will continue to monitor lipid panel with possible plan of initiating lipid-lowering medication if levels do not improve.    4. Follow up She will follow up in 6 months with labs to assess effectiveness of Chantix.  No orders of the defined types were placed in this encounter.  Raliegh IpNatalie Stroud,  MSN, FNP-C [facility] [place] [address] [place], [place] [address] [phone]

## 2018-05-31 ENCOUNTER — Telehealth: Payer: Self-pay | Admitting: Pharmacy Technician

## 2018-05-31 NOTE — Telephone Encounter (Signed)
Spoke with patient.  She indicated that she no longer needed [facility]'s services.  Patient acknowledged that she understood that in order to obtain medication assistance from our clinic, she had to complete eligibility.    Sherilyn DacostaBetty J. Shanikqua Zarzycki Care Manager [facility]

## 2018-06-10 ENCOUNTER — Ambulatory Visit: Payer: Self-pay

## 2018-10-15 ENCOUNTER — Emergency Department
Admission: EM | Admit: 2018-10-15 | Discharge: 2018-10-15 | Disposition: A | Discharge status: Home / Self Care | Payer: Self-pay | Attending: Emergency Medicine | Admitting: Emergency Medicine

## 2018-10-15 ENCOUNTER — Other Ambulatory Visit: Payer: Self-pay

## 2018-10-15 ENCOUNTER — Emergency Department: Payer: Self-pay

## 2018-10-15 ENCOUNTER — Encounter: Payer: Self-pay | Admitting: Emergency Medicine

## 2018-10-15 DIAGNOSIS — R413 Other amnesia: Secondary | ICD-10-CM | POA: Insufficient documentation

## 2018-10-15 DIAGNOSIS — F17210 Nicotine dependence, cigarettes, uncomplicated: Secondary | ICD-10-CM | POA: Insufficient documentation

## 2018-10-15 DIAGNOSIS — I700 Atherosclerosis of aorta: Secondary | ICD-10-CM | POA: Insufficient documentation

## 2018-10-15 DIAGNOSIS — K5790 Diverticulosis of intestine, part unspecified, without perforation or abscess without bleeding: Secondary | ICD-10-CM | POA: Insufficient documentation

## 2018-10-15 DIAGNOSIS — Y9389 Activity, other specified: Secondary | ICD-10-CM | POA: Insufficient documentation

## 2018-10-15 DIAGNOSIS — Y999 Unspecified external cause status: Secondary | ICD-10-CM | POA: Insufficient documentation

## 2018-10-15 DIAGNOSIS — J439 Emphysema, unspecified: Secondary | ICD-10-CM | POA: Insufficient documentation

## 2018-10-15 DIAGNOSIS — S2242XA Multiple fractures of ribs, left side, initial encounter for closed fracture: Secondary | ICD-10-CM | POA: Insufficient documentation

## 2018-10-15 DIAGNOSIS — Y92410 Unspecified street and highway as the place of occurrence of the external cause: Secondary | ICD-10-CM | POA: Insufficient documentation

## 2018-10-15 LAB — CBC
HCT: 39.5 % (ref 36.0–46.0)
Hemoglobin: 13.2 g/dL (ref 12.0–15.0)
MCH: 32 pg (ref 26.0–34.0)
MCHC: 33.4 g/dL (ref 30.0–36.0)
MCV: 95.6 fL (ref 80.0–100.0)
NRBC: 0 % (ref 0.0–0.2)
PLATELETS: 188 10*3/uL (ref 150–400)
RBC: 4.13 MIL/uL (ref 3.87–5.11)
RDW: 13.5 % (ref 11.5–15.5)
WBC: 10.1 10*3/uL (ref 4.0–10.5)

## 2018-10-15 LAB — BASIC METABOLIC PANEL
Anion gap: 9 (ref 5–15)
BUN: 15 mg/dL (ref 6–20)
CALCIUM: 8.8 mg/dL — AB (ref 8.9–10.3)
CO2: 22 mmol/L (ref 22–32)
Chloride: 108 mmol/L (ref 98–111)
Creatinine, Ser: 0.59 mg/dL (ref 0.44–1.00)
GFR calc Af Amer: >60 mL/min (ref >60)
GFR calc non Af Amer: >60 mL/min (ref >60)
GLUCOSE: 131 mg/dL — AB (ref 70–99)
Potassium: 3.9 mmol/L (ref 3.5–5.1)
Sodium: 139 mmol/L (ref 135–145)

## 2018-10-15 MED ORDER — IOPAMIDOL (ISOVUE-300) INJECTION 61%
100.0000 mL | Freq: Once | INTRAVENOUS | Status: AC | PRN
  Administered 2018-10-15: 100 mL via INTRAVENOUS
  Filled 2018-10-15: qty 100

## 2018-10-15 MED ORDER — OXYCODONE-ACETAMINOPHEN 5-325 MG PO TABS
1.0000 | ORAL_TABLET | Freq: Once | ORAL | Status: AC
  Administered 2018-10-15: 1 via ORAL
  Filled 2018-10-15: qty 1

## 2018-10-15 MED ORDER — OXYCODONE-ACETAMINOPHEN 5-325 MG PO TABS: 1.0000 | ORAL_TABLET | Freq: Four times a day (QID) | ORAL | 0 refills | Status: DC | PRN

## 2018-10-15 MED ORDER — MUELLER ADJUSTABLE BACK BRACE MISC: 1.0000 | Freq: Once | 0 refills | Status: AC

## 2018-10-15 MED ORDER — IBUPROFEN 600 MG PO TABS: 600.0000 mg | ORAL_TABLET | Freq: Four times a day (QID) | ORAL | 0 refills | Status: DC | PRN
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
## 2018-10-15 NOTE — ED Notes (Signed)
Patient transported to X-ray 
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
## 2018-11-18 ENCOUNTER — Ambulatory Visit: Payer: Self-pay | Admitting: Adult Health Nurse Practitioner

## 2018-11-18 VITALS — BP 108/68 | HR 76 | Temp 97.5°F | Wt 164.3 lb

## 2018-11-18 DIAGNOSIS — E782 Mixed hyperlipidemia: Secondary | ICD-10-CM

## 2018-11-18 DIAGNOSIS — E785 Hyperlipidemia, unspecified: Secondary | ICD-10-CM | POA: Insufficient documentation

## 2018-11-18 DIAGNOSIS — Z720 Tobacco use: Secondary | ICD-10-CM | POA: Insufficient documentation

## 2018-11-18 DIAGNOSIS — Z0000 Encounter for general adult medical examination without abnormal findings: Secondary | ICD-10-CM

## 2018-11-18 NOTE — Patient Instructions (Signed)

## 2018-11-18 NOTE — Progress Notes (Signed)
  Patient: Tiffany Shea Female    DOB: April 27, 1961   58 y.o.   MRN: [id number] Visit Date: 11/18/2018  Today's Provider: Jacelyn Pi, NP   Chief Complaint  Patient presents with  . Follow-up   Subjective:    HPI   Here for FU.   States that she had to quit Chantix due to insomnia. States she is smoking ppd.  Has patches and plans to try those.   Discussed HLD.    Allergies  Allergen Reactions  . Peanut (Diagnostic) Hives  . Penicillins Rash   Previous Medications   IBUPROFEN (ADVIL,MOTRIN) 600 MG TABLET    Take 1 tablet (600 mg total) by mouth every 6 (six) hours as needed.   OXYCODONE-ACETAMINOPHEN (PERCOCET) 5-325 MG TABLET    Take 1 tablet by mouth every 6 (six) hours as needed for severe pain.   VARENICLINE (CHANTIX) 1 MG TABLET    Give starter package.    Review of Systems  All other systems reviewed and are negative.   Social History   Tobacco Use  . Smoking status: Current Every Day Smoker    Packs/day: 1.00    Years: 30.00    Pack years: 30.00    Types: Cigarettes  . Smokeless tobacco: Never Used  Substance Use Topics  . Alcohol use: No   Objective:   BP 108/68 (BP Location: Left Arm, Patient Position: Sitting, Cuff Size: Large)   Pulse 76   Temp (!) 97.5 F (36.4 C) (Oral)   Wt 164 lb 4.8 oz (74.5 kg)   BMI 28.20 kg/m   Physical Exam Constitutional:      Appearance: Normal appearance.  HENT:     Head: Normocephalic and atraumatic.  Cardiovascular:     Rate and Rhythm: Normal rate and regular rhythm.  Pulmonary:     Effort: Pulmonary effort is normal.     Breath sounds: Examination of the right-lower field reveals decreased breath sounds. Examination of the left-lower field reveals decreased breath sounds. Decreased breath sounds present.  Abdominal:     General: Bowel sounds are normal.     Palpations: Abdomen is soft.  Skin:    General: Skin is warm and dry.  Neurological:     Mental Status: She is alert and oriented to person,  place, and time.         Assessment & Plan:          Smoking cessation instruction/counseling given:  counseled patient on the dangers of tobacco use, advised patient to stop smoking, and reviewed strategies to maximize success. Encouraged to use patches.   HLD:  No desire for medications at this time.  Encourage low cholesterol, low fat diet and exercise.  Risks of unmanaged HLD discussed.    FU in 6 months with labs a week prior.    Jacelyn Pi, NP   [facility]

## 2019-05-12 ENCOUNTER — Other Ambulatory Visit: Payer: Self-pay

## 2019-05-12 DIAGNOSIS — E782 Mixed hyperlipidemia: Secondary | ICD-10-CM

## 2019-05-12 DIAGNOSIS — Z0000 Encounter for general adult medical examination without abnormal findings: Secondary | ICD-10-CM

## 2019-05-13 LAB — COMPREHENSIVE METABOLIC PANEL
ALT: 24 IU/L (ref 0–32)
AST: 23 IU/L (ref 0–40)
Albumin/Globulin Ratio: 1.9 (ref 1.2–2.2)
Albumin: 4.4 g/dL (ref 3.8–4.9)
Alkaline Phosphatase: 129 IU/L — ABNORMAL HIGH (ref 39–117)
BUN/Creatinine Ratio: 15 (ref 9–23)
BUN: 12 mg/dL (ref 6–24)
Bilirubin Total: <0.2 mg/dL (ref 0.0–1.2)
CO2: 24 mmol/L (ref 20–29)
Calcium: 9.2 mg/dL (ref 8.7–10.2)
Chloride: 99 mmol/L (ref 96–106)
Creatinine, Ser: 0.81 mg/dL (ref 0.57–1.00)
GFR calc Af Amer: 92 mL/min/{1.73_m2} (ref >59)
GFR calc non Af Amer: 80 mL/min/{1.73_m2} (ref >59)
Globulin, Total: 2.3 g/dL (ref 1.5–4.5)
Glucose: 121 mg/dL — ABNORMAL HIGH (ref 65–99)
Potassium: 4.4 mmol/L (ref 3.5–5.2)
Sodium: 141 mmol/L (ref 134–144)
Total Protein: 6.7 g/dL (ref 6.0–8.5)

## 2019-05-13 LAB — LIPID PANEL
Chol/HDL Ratio: 6.4 ratio — ABNORMAL HIGH (ref 0.0–4.4)
Cholesterol, Total: 236 mg/dL — ABNORMAL HIGH (ref 100–199)
HDL: 37 mg/dL — ABNORMAL LOW (ref >39)
LDL Calculated: 132 mg/dL — ABNORMAL HIGH (ref 0–99)
Triglycerides: 334 mg/dL — ABNORMAL HIGH (ref 0–149)
VLDL Cholesterol Cal: 67 mg/dL — ABNORMAL HIGH (ref 5–40)

## 2019-05-13 LAB — CBC
Hematocrit: 41.2 % (ref 34.0–46.6)
Hemoglobin: 14.2 g/dL (ref 11.1–15.9)
MCH: 31.6 pg (ref 26.6–33.0)
MCHC: 34.5 g/dL (ref 31.5–35.7)
MCV: 92 fL (ref 79–97)
Platelets: 199 10*3/uL (ref 150–450)
RBC: 4.5 x10E6/uL (ref 3.77–5.28)
RDW: 13.1 % (ref 11.7–15.4)
WBC: 9 10*3/uL (ref 3.4–10.8)

## 2019-05-13 LAB — TSH: TSH: 1.29 u[IU]/mL (ref 0.450–4.500)

## 2019-05-19 ENCOUNTER — Other Ambulatory Visit: Payer: Self-pay

## 2019-05-19 ENCOUNTER — Ambulatory Visit: Payer: Self-pay | Admitting: Adult Health Nurse Practitioner

## 2019-05-19 DIAGNOSIS — F329 Major depressive disorder, single episode, unspecified: Secondary | ICD-10-CM | POA: Insufficient documentation

## 2019-05-19 DIAGNOSIS — F32A Depression, unspecified: Secondary | ICD-10-CM | POA: Insufficient documentation

## 2019-05-19 DIAGNOSIS — F331 Major depressive disorder, recurrent, moderate: Secondary | ICD-10-CM

## 2019-05-19 DIAGNOSIS — E782 Mixed hyperlipidemia: Secondary | ICD-10-CM

## 2019-05-19 MED ORDER — ATORVASTATIN CALCIUM 20 MG PO TABS: 20.0000 mg | ORAL_TABLET | Freq: Every day | ORAL | 3 refills | Status: AC

## 2019-05-19 NOTE — Progress Notes (Signed)
  Patient: Tiffany Shea Female    DOB: 24-Oct-1961   58 y.o.   MRN: [id number] Visit Date: 05/19/2019  Today's Provider: Staci Acosta, NP   Chief Complaint  Patient presents with  . Hyperlipidemia   Subjective:    HPI  Last visit LDL was elevated- she didn't wish to start medications.  Current LDL-132  Pt states that she thinks she is in menopause. States that she is "real irritable and snapping at people" States that she hasn't had a menses in 10+ years.  States that she is having negative thoughts- feeling agitated and sad. States that she has experienced this before.  Denies SI/HI.    Allergies  Allergen Reactions  . Peanut (Diagnostic) Hives  . Penicillins Rash   Previous Medications   No medications on file    Review of Systems  All other systems reviewed and are negative.   Social History   Tobacco Use  . Smoking status: Current Every Day Smoker    Packs/day: 1.00    Years: 30.00    Pack years: 30.00    Types: Cigarettes  . Smokeless tobacco: Never Used  Substance Use Topics  . Alcohol use: No   Objective:   There were no vitals taken for this visit.  Physical Exam  No PE.     Assessment & Plan:     HLD:  Not Controlled.  Start Lipitor 20mg  nightly.  Encourage low cholesterol, low fat diet and exercise.    Depression/Mood:  Referral to [facility].     FU in 6 weeks for HLD management and order LFTs.      I connected with  Antony Blackbird on 05/19/19 via telephonic visit and verified that I am speaking with the correct person using two identifiers.   I discussed the limitations of evaluation and management by telemedicine. The patient expressed understanding and agreed to proceed.    Staci Acosta, NP   [facility]  .tele

## 2019-06-23 ENCOUNTER — Ambulatory Visit: Payer: Self-pay

## 2019-06-29 ENCOUNTER — Ambulatory Visit: Payer: Self-pay | Admitting: Gerontology

## 2019-06-29 ENCOUNTER — Encounter: Payer: Self-pay | Admitting: Gerontology

## 2019-06-29 ENCOUNTER — Other Ambulatory Visit: Payer: Self-pay

## 2019-06-29 DIAGNOSIS — E782 Mixed hyperlipidemia: Secondary | ICD-10-CM

## 2019-06-29 DIAGNOSIS — Z720 Tobacco use: Secondary | ICD-10-CM

## 2019-06-29 DIAGNOSIS — Z8659 Personal history of other mental and behavioral disorders: Secondary | ICD-10-CM | POA: Insufficient documentation

## 2019-06-29 NOTE — Progress Notes (Signed)
Established Patient Office Visit  Subjective:  Patient ID: Tiffany Shea, female    DOB: 1961/07/11  Age: 58 y.o. MRN: [id number]  CC:  Chief Complaint  Patient presents with  . Hyperlipidemia  Patient consents to telephone visit and 2 patient identifiers was used to identify patient.  HPI Tiffany Shea presents for follow up of hyperlipidemia . Her lipid panel done 1 month ago, total cholesterol was 236, Triglyceride 334, HDL 37 and LDL 132 mg/dl. She reports that she sometime forgets to take her Atorvastatin 20 mg, she's trying to adhere to low fat low cholesterol diet and exercises as tolerated. She denies myalgia and dark urine. She continues to smoke 1 pack of cigarette daily and admits the desire to quit. She reports that her mood fluctuates , but denies suicidal or homicidal ideation and requests to see a mental health provider. She declines having mammogram, pap smear and colonoscopy . She denies chest pain, palpitation, light headedness, fever, chills and no further concerns.  Past Medical History:  Diagnosis Date  . Cholecystitis   . Depression   . Hyperlipidemia     Past Surgical History:  Procedure Laterality Date  . CHOLECYSTECTOMY N/A 12/01/2016   Procedure: LAPAROSCOPIC CHOLECYSTECTOMY;  Surgeon: Lattie Hawichard E Cooper, MD;  Location: [facility] ORS;  Service: General;  Laterality: N/A;  . FEMUR FRACTURE SURGERY Bilateral 1972  . PELVIC FRACTURE SURGERY  1972  . TONSILLECTOMY  1968    Family History  Problem Relation Age of Onset  . Heart disease Mother   . Lung cancer Father     Social History   Socioeconomic History  . Marital status: Married    Spouse name: Not on file  . Number of children: Not on file  . Years of education: Not on file  . Highest education level: Not on file  Occupational History  . Not on file  Social Needs  . Financial resource strain: Somewhat hard  . Food insecurity    Worry: Sometimes true    Inability: Never true  . Transportation needs     Medical: No    Non-medical: No  Tobacco Use  . Smoking status: Current Every Day Smoker    Packs/day: 1.00    Years: 30.00    Pack years: 30.00    Types: Cigarettes  . Smokeless tobacco: Never Used  Substance and Sexual Activity  . Alcohol use: No  . Drug use: No  . Sexual activity: Never  Lifestyle  . Physical activity    Days per week: 0 days    Minutes per session: Not on file  . Stress: To some extent  Relationships  . Social Musicianconnections    Talks on phone: Not on file    Gets together: Not on file    Attends religious service: Not on file    Active member of club or organization: Not on file    Attends meetings of clubs or organizations: Not on file    Relationship status: Not on file  . Intimate partner violence    Fear of current or ex partner: Not on file    Emotionally abused: Not on file    Physically abused: Not on file    Forced sexual activity: Not on file  Other Topics Concern  . Not on file  Social History Narrative  . Not on file    Outpatient Medications Prior to Visit  Medication Sig Dispense Refill  . atorvastatin (LIPITOR) 20 MG tablet Take 1 tablet (20 mg total)  by mouth daily at 6 PM. 30 tablet 3   No facility-administered medications prior to visit.     Allergies  Allergen Reactions  . Peanut (Diagnostic) Hives  . Penicillins Rash    ROS Review of Systems  Constitutional: Negative.   HENT: Negative.   Respiratory: Negative.   Cardiovascular: Negative.   Gastrointestinal: Negative.   Genitourinary: Negative.   Musculoskeletal: Negative.   Skin: Negative.   Neurological: Negative.   Psychiatric/Behavioral:       States her mood is up and down.      Objective:    Physical Exam No vital sign or PE was done. There were no vitals taken for this visit. Wt Readings from Last 3 Encounters:  11/18/18 164 lb 4.8 oz (74.5 kg)  10/15/18 165 lb (74.8 kg)  05/13/18 166 lb (75.3 kg)     Health Maintenance Due  Topic Date Due  .  Hepatitis C Screening  10/[birth year]/59  . HIV Screening  08/18/1974  . TETANUS/TDAP  08/18/1978  . PAP SMEAR-Modifier  08/18/1980  . MAMMOGRAM  08/18/2009  . COLONOSCOPY  08/18/2009  . INFLUENZA VACCINE  06/04/2019    She declines having Mammogram, pap smear and colonoscopy.  Lab Results  Component Value Date   TSH 1.290 05/12/2019   Lab Results  Component Value Date   WBC 9.0 05/12/2019   HGB 14.2 05/12/2019   HCT 41.2 05/12/2019   MCV 92 05/12/2019   PLT 199 05/12/2019   Lab Results  Component Value Date   NA 141 05/12/2019   K 4.4 05/12/2019   CO2 24 05/12/2019   GLUCOSE 121 (H) 05/12/2019   BUN 12 05/12/2019   CREATININE 0.81 05/12/2019   BILITOT <0.2 05/12/2019   ALKPHOS 129 (H) 05/12/2019   AST 23 05/12/2019   ALT 24 05/12/2019   PROT 6.7 05/12/2019   ALBUMIN 4.4 05/12/2019   CALCIUM 9.2 05/12/2019   ANIONGAP 9 10/15/2018   Lab Results  Component Value Date   CHOL 236 (H) 05/12/2019   Lab Results  Component Value Date   HDL 37 (L) 05/12/2019   Lab Results  Component Value Date   LDLCALC 132 (H) 05/12/2019   Lab Results  Component Value Date   TRIG 334 (H) 05/12/2019   Lab Results  Component Value Date   CHOLHDL 6.4 (H) 05/12/2019   No results found for: HGBA1C    Assessment & Plan:     1. Mixed hyperlipidemia - She was encouraged to continue to take 20 mg Atorvastatin daily. -Low fat Diet, like low fat dairy products eg skimmed milk -Avoid any fried food -Regular exercise/walk -Goal for Total Cholesterol is less than 200 -Goal for bad cholesterol LDL is less than 100 -Goal for Good cholesterol HDL is more than 45 -Goal for Triglyceride is less than 150 - HgB A1c; Future - Lipid panel; Future  2. Tobacco use - She was strongly advised on smoking cessation.  3. History of depression - She will follow up with Ms. Jene Every mental health counselor at [facility] - She was advised to call the clinic or crisis line for worsening anxiety.    Follow-up: Return in about 8 weeks (around 08/23/2019), or if symptoms worsen or fail to improve.    Brandon Scarbrough Jerold Coombe, NP

## 2019-06-29 NOTE — Patient Instructions (Signed)
Fat and Cholesterol Restricted Eating Plan Getting too much fat and cholesterol in your diet may cause health problems. Choosing the right foods helps keep your fat and cholesterol at normal levels. This can keep you from getting certain diseases. Your doctor may recommend an eating plan that includes:  Total fat: ______% or less of total calories a day.  Saturated fat: ______% or less of total calories a day.  Cholesterol: less than _________mg a day.  Fiber: ______g a day. What are tips for following this plan? Meal planning  At meals, divide your plate into four equal parts: ? Fill one-half of your plate with vegetables and green salads. ? Fill one-fourth of your plate with whole grains. ? Fill one-fourth of your plate with low-fat (lean) protein foods.  Eat fish that is high in omega-3 fats at least two times a week. This includes mackerel, tuna, sardines, and salmon.  Eat foods that are high in fiber, such as whole grains, beans, apples, broccoli, carrots, peas, and barley. General tips   Work with your doctor to lose weight if you need to.  Avoid: ? Foods with added sugar. ? Fried foods. ? Foods with partially hydrogenated oils.  Limit alcohol intake to no more than 1 drink a day for nonpregnant women and 2 drinks a day for men. One drink equals 12 oz of beer, 5 oz of wine, or 1 oz of hard liquor. Reading food labels  Check food labels for: ? Trans fats. ? Partially hydrogenated oils. ? Saturated fat (g) in each serving. ? Cholesterol (mg) in each serving. ? Fiber (g) in each serving.  Choose foods with healthy fats, such as: ? Monounsaturated fats. ? Polyunsaturated fats. ? Omega-3 fats.  Choose grain products that have whole grains. Look for the word "whole" as the first word in the ingredient list. Cooking  Cook foods using low-fat methods. These include baking, boiling, grilling, and broiling.  Eat more home-cooked foods. Eat at restaurants and buffets  less often.  Avoid cooking using saturated fats, such as butter, cream, palm oil, palm kernel oil, and coconut oil. Recommended foods  Fruits  All fresh, canned (in natural juice), or frozen fruits. Vegetables  Fresh or frozen vegetables (raw, steamed, roasted, or grilled). Green salads. Grains  Whole grains, such as whole wheat or whole grain breads, crackers, cereals, and pasta. Unsweetened oatmeal, bulgur, barley, quinoa, or brown rice. Corn or whole wheat flour tortillas. Meats and other protein foods  Ground beef (85% or leaner), grass-fed beef, or beef trimmed of fat. Skinless chicken or turkey. Ground chicken or turkey. Pork trimmed of fat. All fish and seafood. Egg whites. Dried beans, peas, or lentils. Unsalted nuts or seeds. Unsalted canned beans. Nut butters without added sugar or oil. Dairy  Low-fat or nonfat dairy products, such as skim or 1% milk, 2% or reduced-fat cheeses, low-fat and fat-free ricotta or cottage cheese, or plain low-fat and nonfat yogurt. Fats and oils  Tub margarine without trans fats. Light or reduced-fat mayonnaise and salad dressings. Avocado. Olive, canola, sesame, or safflower oils. The items listed above may not be a complete list of foods and beverages you can eat. Contact a dietitian for more information. Foods to avoid Fruits  Canned fruit in heavy syrup. Fruit in cream or butter sauce. Fried fruit. Vegetables  Vegetables cooked in cheese, cream, or butter sauce. Fried vegetables. Grains  White bread. White pasta. White rice. Cornbread. Bagels, pastries, and croissants. Crackers and snack foods that contain trans fat   and hydrogenated oils. Meats and other protein foods  Fatty cuts of meat. Ribs, chicken wings, bacon, sausage, bologna, salami, chitterlings, fatback, hot dogs, bratwurst, and packaged lunch meats. Liver and organ meats. Whole eggs and egg yolks. Chicken and turkey with skin. Fried meat. Dairy  Whole or 2% milk, cream,  half-and-half, and cream cheese. Whole milk cheeses. Whole-fat or sweetened yogurt. Full-fat cheeses. Nondairy creamers and whipped toppings. Processed cheese, cheese spreads, and cheese curds. Beverages  Alcohol. Sugar-sweetened drinks such as sodas, lemonade, and fruit drinks. Fats and oils  Butter, stick margarine, lard, shortening, ghee, or bacon fat. Coconut, palm kernel, and palm oils. Sweets and desserts  Corn syrup, sugars, honey, and molasses. Candy. Jam and jelly. Syrup. Sweetened cereals. Cookies, pies, cakes, donuts, muffins, and ice cream. The items listed above may not be a complete list of foods and beverages you should avoid. Contact a dietitian for more information. Summary  Choosing the right foods helps keep your fat and cholesterol at normal levels. This can keep you from getting certain diseases.  At meals, fill one-half of your plate with vegetables and green salads.  Eat high-fiber foods, like whole grains, beans, apples, carrots, peas, and barley.  Limit added sugar, saturated fats, alcohol, and fried foods. This information is not intended to replace advice given to you by your health care provider. Make sure you discuss any questions you have with your health care provider. Document Released: 04/20/2012 Document Revised: 06/23/2018 Document Reviewed: 07/07/2017 Elsevier Patient Education  2020 Elsevier Inc.  

## 2019-06-30 ENCOUNTER — Ambulatory Visit: Payer: Self-pay

## 2019-07-07 ENCOUNTER — Other Ambulatory Visit: Payer: Self-pay

## 2019-07-07 ENCOUNTER — Ambulatory Visit: Payer: Self-pay | Admitting: Licensed Clinical Social Worker

## 2019-07-07 DIAGNOSIS — F331 Major depressive disorder, recurrent, moderate: Secondary | ICD-10-CM

## 2019-07-07 DIAGNOSIS — F411 Generalized anxiety disorder: Secondary | ICD-10-CM

## 2019-07-07 NOTE — BH Specialist Note (Signed)
Integrated Behavioral Health Comprehensive Clinical Assessment Via Phone  MRN: [id number] Name: Tiffany Shea  Type of Service: Integrated Behavioral Health-Individual Interpretor: No. Interpretor Name and Language: not applicable.   PRESENTING CONCERNS: Tiffany Shea is a 58 y.o. female accompanied by herself.Tiffany Shea was referred to [facility] clinician for mental health.  Previous mental health services Have you ever been treated for a mental health problem? Yes If "Yes", when were you treated and whom did you see? Tiffany Shea previously saw a therapist and psychiatrist at the mental health center on and off for several years until it closed. She is unable to remember the name of the facility, just that it was located in [place], [place].  Have you ever been hospitalized for mental health treatment? Yes Have you ever been treated for any of the following? Past Psychiatric History/Hospitalization(s): Anxiety: Yes Tiffany Shea reports that she has been experiencing anxiety all her life. Her symptoms include: feeling nervous, anxious, or on edge nearly everyday, not being able to stop or control her worrying, worrying too much about different things, difficulty relaxing, restlessness, becoming easily annoyed or irritable, and feeling afraid as if something awful might happen.  Bipolar Disorder: Negative Depression: Yes Tiffany Shea reports that her depression has been present for a long time. She reports that she was previously prescribed Norpamin on and off for several years. She explains that she stopped taking it in part due to side effects and because her husband requested that she no longer take the medication anymore. Her symptoms include: feeling down and depressed nearly every day, loss of interest in previously enjoyed activities, insomnia, fatigue, poor appetite, feeling bad about herself, difficulty concentrating, and restlessness. She denies suicidal and homicidal thoughts. She  denies that their is access to a fire arm in the home.  Mania: Negative Psychosis: Negative Schizophrenia: Negative Personality Disorder: Negative Hospitalization for psychiatric illness: Yes Tiffany Shea voluntarily went to the psych ward twice in one week ten to fifteen years ago in [facility] due to separating from her husband at the time and having suicidal thoughts.  History of Electroconvulsive Shock Therapy: Negative Prior Suicide Attempts: Negative Have you ever had thoughts of harming yourself or others or attempted suicide? No plan to harm self or others  Medical history  has a past medical history of Cholecystitis, Depression, and Hyperlipidemia. Primary Care Physician: Patient, No Pcp Per Date of last physical exam:  Allergies:  Allergies  Allergen Reactions  . Peanut (Diagnostic) Hives  . Penicillins Rash   Current medications:  Outpatient Encounter Medications as of 07/07/2019  Medication Sig  . atorvastatin (LIPITOR) 20 MG tablet Take 1 tablet (20 mg total) by mouth daily at 6 PM.   No facility-administered encounter medications on file as of 07/07/2019.    Have you ever had any serious medication reactions? Yes- Penicillin. She is allergic to peanuts.  Is there any history of mental health problems or substance abuse in your family? Yes- Tiffany Shea reports that her mom was on antidepressants when she was growing up and slept nearly 20 hours a day. She explains that her mom was instituionalized several times. She denies any history of substance abuse in the family.  Has anyone in your family been hospitalized for mental health treatment? Yes- see above.   Social/family history Who lives in your current household? Tiffany Shea lives with her husband of 30 years. She has two sons in their 108's and no grandchildren.  What is your family of  origin, childhood history? [place] born [place] and was raised by her parents.  Where were you born? See above.  Where did  you grow up? [place] She[place] grew up in [place].  How many different homes have you lived in? A few.  Describe your childhood: Tiffany Shea describes her childhood as being pretty crappy due to being left on her own with her brother to raise themselves. She explains that her dad worked third shift and her mom slept all the time.  Do you have siblings, step/half siblings? Yes- Tiffany Shea has a younger brother and they are 8 years apart.  What are their names, relation, sex, age? See above.  Are your parents separated or divorced? No What are your social supports? Tiffany Shea has the support of her husband. She was attending church until the pandemic occurred.   Education How many grades have you completed? 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  Did you have any problems in school? 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. She has been working at [employer] for the past 7 years.   Sleep Usual bedtime varies.  Sleeping arrangements: with her husband.  Problems with snoring: Yes Obstructive sleep apnea is not a concern. Problems with nightmares: No Problems with night terrors: No Problems with sleepwalking: No  Trauma/Abuse history Have you ever experienced or been exposed to any form of abuse? No Have you ever experienced or been exposed to something traumatic? No  Substance use Do you use alcohol, nicotine or caffeine? Tiffany Shea admits to smoking a pack of cigarettes a day.  How old were you when you first tasted alcohol? Tiffany Shea denies abusing alcohol or other drugs. She denies ever being in substance abuse treatment.  Have you ever used illicit drugs or abused prescription medications? Tiffany Shea denies abusing alcohol or other drugs.   Mental status General appearance/Behavior: Unable to assess due to phone visit. Eye contact: Absent due to assessment completed via phone because of COVID 19. Motor behavior: unable to assess due to assessment completed via phone. Speech:  Normal Level of consciousness: Alert Mood: Euthymic Affect: Appropriate Anxiety level: Minimal Thought process: Coherent Thought content: WNL Perception: Normal Judgment: Good Insight: Present  Diagnosis No diagnosis found.  GOALS ADDRESSED: Patient will reduce symptoms of: anxiety and depression and increase knowledge and/or ability of: coping skills, healthy habits and self-management skills and also: Increase healthy adjustment to current life circumstances              INTERVENTIONS: Interventions utilized: Psychoeducation and/or Health Education Standardized Assessments completed: GAD-7 and PHQ 9   ASSESSMENT/OUTCOME:  Tiffany Shea is a 58 year old Caucasian female who presents today for a mental health assessment via phone due to COVID 19 and was referred to Hurman HornElizabeth Chioma, NP. Tiffany Shea reports that she has been dealing with depression for the last thirty years and has anxiety anxiety all her life. She previously saw a psychiatrist and therapist at the mental health center on and off for several years until they closed. She was previously prescribed Norpamin for depression but is unable to remember the milligrams. She reports that she stopped taking the Norpamin due to it causing cotton mouth and her husband asking her to go off of the medication. She was voluntarily went to the hospital twice in one week ten to fifteen years ago after temporarily separating form her [place]. She denies abusing alcohol or other  drugs.  Tiffany Shea is an established patient at [facility]. She has a history of hyperlipidemia, allergies, and Cholecystitis. She smokes a pack of cigarettes a day. She has had a cholecystectomy, femur fracture surgery, pelvic fracture surgery, and tonsillectomy.   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 She has been working at [employer] for the past seven years. She lives with her husband of 72 years. She has two son's in her 30 years  and no grandchildren. She has the support of her husband. She was previously attending church on Sunday's when she was not working until the pandemic begin.   There is a history of mental illness in her family. Her mom was on antidepressants when she was growing up and slept about 20 hours a day. She notes that her mom was institutionalized several times. She explains that she was never told what her mom's diagnosis was and she passed away in 02-26-82. She denies any history of substance abuse in the family.   PLAN: Case consultation with Dr. Octavia Heir, MD, psychiatric consultant on Tuesday September 8th @ 9 am recommend that Tiffany Shea start Mirtazapine 15 mg at bedtime for insomnia, depression, and anxiety. No further recommendations at this point in time.   Scheduled next visit: Follow up in two weeks or earlier if needed.  [place] [employer]

## 2019-07-12 ENCOUNTER — Other Ambulatory Visit: Payer: Self-pay

## 2019-07-12 MED ORDER — MIRTAZAPINE 15 MG PO TABS: 15.0000 mg | ORAL_TABLET | Freq: Every day | ORAL | 0 refills | Status: DC

## 2019-07-21 ENCOUNTER — Other Ambulatory Visit: Payer: Self-pay

## 2019-07-21 ENCOUNTER — Ambulatory Visit: Payer: Self-pay | Admitting: Licensed Clinical Social Worker

## 2019-07-21 DIAGNOSIS — F331 Major depressive disorder, recurrent, moderate: Secondary | ICD-10-CM

## 2019-07-21 DIAGNOSIS — F411 Generalized anxiety disorder: Secondary | ICD-10-CM

## 2019-07-21 NOTE — BH Specialist Note (Signed)
Integrated Behavioral Health Follow Up Visit Via Phone  MRN: [id number] Name: Tiffany Shea  Number of [facility] Clinician visits: 1/6  Type of Service: [place] Interpretor:No. Interpretor Name and Language: not applicable.   SUBJECTIVE: Lundynn Cohoon is a 58 y.o. female accompanied by herself. Patient was referred by  Patient reports the following symptoms/concerns: She explains that she is anxious to get started on a medication to help with her anxiety and depression. She asked if she will experience any side effects and if it is similar to the medication that she was previously on many years ago. She explains that most of the time she can be in a negative state of mind and is wanting to quit smoking. She notes that she works at [employer] second shift five days a week and being in that environment its hard to be around a bunch of depressed women. She notes that she is hoping to find another job because she gets bored with it. She denies suicidal and homicidal thoughts.  Duration of problem: ; Severity of problem: moderate  OBJECTIVE: Mood: Euthymic and Affect: Appropriate Risk of harm to self or others: No plan to harm self or others  LIFE CONTEXT: Family and Social: see above. School/Work: see above. Self-Care: see above. Life Changes: see above.   GOALS ADDRESSED: Patient will: 1.  Reduce symptoms of: anxiety and insomnia  2.  Increase knowledge and/or ability of: coping skills and stress reduction  3.  Demonstrate ability to: Increase healthy adjustment to current life circumstances  INTERVENTIONS: Interventions utilized:  Psychoeducation and/or Health Education was utilized by the clinician during today's follow up session. Clinician processed with the patient regarding how she has been doing since the last follow up session. Clinician explained to the patient that she had a case consultation with Dr. Octavia Heir, MD, psychiatric  consultant who recommended starting her on an antidepressant called Mirtazapine 15 mg at bedtime for anxiety, depression, and insomnia. Clinician explained to the patient that its important to take this medication before bed time because it will cause her to feel sedated and sleepy. Clinician explained to the patient that hopefully this medication can assist with reducing her insomnia. Clinician discussed with the patient if she has been feeling down or depressed in the last two weeks. Clinician discussed with the patient her anxiety and the contributing factors.  Standardized Assessments completed: GAD-7 and PHQ 9  ASSESSMENT: Patient currently experiencing see above.   Patient may benefit from see above.  PLAN: 1. Follow up with behavioral health clinician on : two weeks or earlier if needed. 2. Behavioral recommendations: see above.  3. Referral(s): [place] (In Clinic) 4. "From scale of 1-10, how likely are you to follow plan?":   Bayard Hugger, LCSW

## 2019-08-04 ENCOUNTER — Ambulatory Visit: Payer: Self-pay | Admitting: Licensed Clinical Social Worker

## 2019-08-17 ENCOUNTER — Other Ambulatory Visit: Payer: Self-pay

## 2019-08-17 DIAGNOSIS — E782 Mixed hyperlipidemia: Secondary | ICD-10-CM

## 2019-08-18 ENCOUNTER — Ambulatory Visit: Payer: Self-pay | Admitting: Licensed Clinical Social Worker

## 2019-08-18 DIAGNOSIS — F411 Generalized anxiety disorder: Secondary | ICD-10-CM

## 2019-08-18 DIAGNOSIS — F331 Major depressive disorder, recurrent, moderate: Secondary | ICD-10-CM

## 2019-08-18 LAB — HEMOGLOBIN A1C
Est. average glucose Bld gHb Est-mCnc: 114 mg/dL
Hgb A1c MFr Bld: 5.6 % (ref 4.8–5.6)

## 2019-08-18 LAB — LIPID PANEL
Chol/HDL Ratio: 4.6 ratio — ABNORMAL HIGH (ref 0.0–4.4)
Cholesterol, Total: 165 mg/dL (ref 100–199)
HDL: 36 mg/dL — ABNORMAL LOW (ref >39)
LDL Chol Calc (NIH): 90 mg/dL (ref 0–99)
Triglycerides: 231 mg/dL — ABNORMAL HIGH (ref 0–149)
VLDL Cholesterol Cal: 39 mg/dL (ref 5–40)

## 2019-08-18 NOTE — BH Specialist Note (Signed)
Integrated Behavioral Health Follow Up Visit  MRN: [id number] Name: Tiffany Shea  Number of [facility] Clinician visits: 2/6  Type of Service: [place] Interpretor:No. Interpretor Name and Language: Not applicable.   SUBJECTIVE: Tiffany Shea is a 58 y.o. female accompanied by herself. Patient was referred by Carlyon Shadow NP for mental health. Patient reports the following symptoms/concerns: She notes that she feels her depression has worsened since starting the Mirtazapine because of worsening thoughts. She asked if her hands hurting and going numb is a side effect of the Mirtazapine. She explains that if she takes it too late that she feels like it will keep her awake and do the opposite of what its supposed to do. She notes that if she takes it early enough before bed that it seems to help her sleep. She explains that she missed a few doses but is trying to get in the habit of taking the medication on time. She denies suicidal and homicidal thoughts.  Duration of problem: ; Severity of problem: severe  OBJECTIVE: Mood: Euthymic and Affect: Appropriate Risk of harm to self or others: No plan to harm self or others  LIFE CONTEXT: Family and Social: see above. School/Work: see above. Self-Care: see above. Life Changes: see above.  GOALS ADDRESSED: Patient will: 1.  Reduce symptoms of: depression  2.  Increase knowledge and/or ability of: healthy habits  3.  Demonstrate ability to: Increase healthy adjustment to current life circumstances  INTERVENTIONS: Interventions utilized:  Psychoeducation and/or Health Education was utilized by the clinician during today's follow up session. Clinician processed with the patient regarding how she has been doing since the last follow up session. Clinician conducted a PHQ 9 and GAD 7. Clinician explained that to her knowledge that numbness and pain in hands is not typicaly a side effect of  Mirtazapine. Clinician explained to the patient that she would have a case consultation with Dr. Octavia Heir, MD, psychiatric consultant to discuss the medication and no changes in her symptoms of depression.  Standardized Assessments completed: GAD-7 and PHQ 9  ASSESSMENT: Patient currently experiencing see above.  Patient may benefit from see above.   PLAN: 1. Follow up with behavioral health clinician on : October 29th via phone @ 10 am.  2. Behavioral recommendations: Case consultation with Dr. Octavia Heir, MD, psychiatric consultant on October 20th, 2020 @ 9 am who recommended increasing Ms. Solazzo's Mirtazapine from 15 mg to 30 mg at bedtime. Advise patient that numbness and pain in her hands is not dangerous but something to keep an eye on. Side effects of Mirtazapine do not include pain and numbness in the hands. Continue to monitor pain and numbness and patient may need to follow up with a provider in clinic if pain persists.  3. Referral(s): [facility] (In Clinic) 4. "From scale of 1-10, how likely are you to follow plan?":   Bayard Hugger, LCSW

## 2019-08-23 ENCOUNTER — Telehealth: Payer: Self-pay | Admitting: Licensed Clinical Social Worker

## 2019-08-23 ENCOUNTER — Ambulatory Visit: Payer: Self-pay | Admitting: Gerontology

## 2019-08-23 NOTE — Telephone Encounter (Signed)
Clinician left patient a voicemail with contact information regarding that the changes to her medications and addressing her concerns for the tingling/numbness in her hands.

## 2019-08-24 ENCOUNTER — Other Ambulatory Visit: Payer: Self-pay

## 2019-08-24 MED ORDER — MIRTAZAPINE 30 MG PO TABS: 30.0000 mg | ORAL_TABLET | Freq: Every day | ORAL | 0 refills | Status: DC

## 2019-08-25 ENCOUNTER — Other Ambulatory Visit: Payer: Self-pay | Admitting: Gerontology

## 2019-09-01 ENCOUNTER — Ambulatory Visit: Payer: Self-pay | Admitting: Licensed Clinical Social Worker

## 2019-09-08 ENCOUNTER — Ambulatory Visit: Payer: Self-pay | Admitting: Licensed Clinical Social Worker

## 2019-09-08 ENCOUNTER — Other Ambulatory Visit: Payer: Self-pay

## 2019-09-08 DIAGNOSIS — F331 Major depressive disorder, recurrent, moderate: Secondary | ICD-10-CM

## 2019-09-08 DIAGNOSIS — F411 Generalized anxiety disorder: Secondary | ICD-10-CM

## 2019-09-08 NOTE — BH Specialist Note (Signed)
Integrated Behavioral Health Follow Up Visit Via Phone  MRN: [id number] Name: Tiffany Shea  Number of [facility] Clinician visits: 2/6  Type of Service: [place] Interpretor:No. Interpretor Name and Language: not applicable.   SUBJECTIVE: Tiffany Shea is a 58 y.o. female accompanied by herself. Patient was referred by Carlyon Shadow NP for mental health.  Patient reports the following symptoms/concerns: She reports that she is able to sleep at night as long as she does not take the Mirtazapine too late or forgets to take it. She explains that she is still experiencing numbness and tingling in her hands when she first wakes up in the morning. She explains that she thinks its due to the way she is sleeping on her side. She explains that her mood has been a lot better. She explains that she thinks she needs to get more exercise due to being out of shape. She explains that she has been dealing with a little more stress than usual due to her car no longer working and has to arrange transportation to work. She denies suicidal and homicidal thoughts.  Duration of problem: ; Severity of problem: mild  OBJECTIVE: Mood: Euthymic and Affect: Appropriate Risk of harm to self or others: No plan to harm self or others  LIFE CONTEXT: Family and Social: see above. School/Work: see above. Self-Care: see above. Life Changes: see above.   GOALS ADDRESSED: Patient will: 1.  Reduce symptoms of: anxiety  2.  Increase knowledge and/or ability of: self-management skills and stress reduction  3.  Demonstrate ability to: Increase healthy adjustment to current life circumstances  INTERVENTIONS: Interventions utilized:  Supportive Counseling was utilized by the clinician focusing on the patient's anxiety. Clinician processed with the patient regarding how she has been doing since the last follow up session. Clinician discussed with the patient regarding the  factors that she has been experiencing in terms of her stress. Clinician encouraged the patient to have a mechanic look at her car to see if there is anything that can be done to fix it. Clinician encouraged the patient to take her medications as prescribed, practice self care, and set an alarm to remember to take her medications on time.  Standardized Assessments completed: GAD-7 and PHQ 9  ASSESSMENT: Patient currently experiencing see above.   Patient may benefit from see above.  PLAN: 1. Follow up with behavioral health clinician on : three to four weeks or earlier if needed. 2. Behavioral recommendations: see above. 3. Referral(s): [facility] (In Clinic) 4. "From scale of 1-10, how likely are you to follow plan?":  Bayard Hugger, LCSW

## 2019-09-13 ENCOUNTER — Telehealth: Payer: Self-pay | Admitting: Gerontology

## 2019-10-04 ENCOUNTER — Ambulatory Visit: Payer: Self-pay | Admitting: Licensed Clinical Social Worker

## 2019-10-04 ENCOUNTER — Telehealth: Payer: Self-pay | Admitting: Licensed Clinical Social Worker

## 2019-10-04 NOTE — Telephone Encounter (Signed)
Clinician attempted to contact the patient three times at the time of the scheduled phone visit but there was not an option to leave a voicemail or call back number.

## 2019-10-20 ENCOUNTER — Telehealth: Payer: Self-pay | Admitting: Pharmacy Technician

## 2019-10-20 NOTE — Telephone Encounter (Signed)
Spoke with patient.  Patient indicated that she prefers to obtain her medications from [facility].  Explained that patient could obtain medications from [facility] for free.  Patient does not want to use [facility], wants to go to [facility].  If patient changes mind, she is to contact me to discuss eligibility.  [facility]

## 2020-04-03 ENCOUNTER — Ambulatory Visit: Payer: Self-pay
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
## 2024-01-28 ENCOUNTER — Emergency Department
Admission: EM | Admit: 2024-01-28 | Discharge: 2024-01-28 | Disposition: A | Discharge status: Home / Self Care | Payer: Self-pay | Attending: Emergency Medicine | Admitting: Emergency Medicine

## 2024-01-28 ENCOUNTER — Emergency Department: Payer: Self-pay

## 2024-01-28 ENCOUNTER — Encounter: Payer: Self-pay | Admitting: Emergency Medicine

## 2024-01-28 ENCOUNTER — Other Ambulatory Visit: Payer: Self-pay

## 2024-01-28 DIAGNOSIS — M25511 Pain in right shoulder: Secondary | ICD-10-CM | POA: Insufficient documentation

## 2024-01-28 MED ORDER — CYCLOBENZAPRINE HCL 5 MG PO TABS: 5.0000 mg | ORAL_TABLET | Freq: Three times a day (TID) | ORAL | 0 refills | Status: AC | PRN

## 2024-01-28 MED ORDER — LIDOCAINE 5 % EX PTCH: 1.0000 | MEDICATED_PATCH | Freq: Two times a day (BID) | CUTANEOUS | 0 refills | Status: AC

## 2024-01-28 MED ORDER — LIDOCAINE 5 % EX PTCH
1.0000 | MEDICATED_PATCH | CUTANEOUS | Status: DC
  Administered 2024-01-28: 1 via TRANSDERMAL
  Filled 2024-01-28: qty 1

## 2024-01-28 NOTE — ED Provider Notes (Addendum)
 [facility] Provider Note    Event Date/Time   First MD Initiated Contact with Patient 01/28/24 1241     (approximate)   History   Shoulder Pain   HPI  Tiffany Shea is a 63 y.o. female with depression, anxiety, hyperlipidemia who comes in with right shoulder pain.  Patient reports over the past 3 months having right shoulder pain.  She reports that the pain comes and goes and is worse with certain movements.  The pain is mostly behind the shoulder and radiating down to the mid part of the shoulder.  She states that sometimes she is not able to move it fully and if she does she develops certain pain.  She states that she was worried about a possible ligament injury, rotator cuff injury.  She denies any known mechanism where she could have injured it.  She denies any fevers.  She denies any swelling of the arm, chest pain, shortness of breath or other concerns.  She does report intermittently feeling spasms in her right shoulder.  Physical Exam   Triage Vital Signs: ED Triage Vitals  Encounter Vitals Group     BP 01/28/24 1214 133/61     Systolic BP Percentile --      Diastolic BP Percentile --      Pulse Rate 01/28/24 1214 (!) 56     Resp 01/28/24 1214 18     Temp 01/28/24 1214 98.4 F (36.9 C)     Temp Source 01/28/24 1214 Oral     SpO2 01/28/24 1214 98 %     Weight 01/28/24 1214 165 lb (74.8 kg)     Height 01/28/24 1214 5\' 4"  (1.626 m)     Head Circumference --      Peak Flow --      Pain Score 01/28/24 1218 2     Pain Loc --      Pain Education --      Exclude from Growth Chart --     Most recent vital signs: Vitals:   01/28/24 1214  BP: 133/61  Pulse: (!) 56  Resp: 18  Temp: 98.4 F (36.9 C)  SpO2: 98%     General: Awake, no distress.  CV:  Good peripheral perfusion.  Resp:  Normal effort.  Abd:  No distention.  Other:  Clear lungs bilaterally.  Right shoulder without any redness or erythema noted, somewhat limited range of motion  secondary to some mild discomfort.  Good distal pulse ulnar nerves appear intact.   ED Results / Procedures / Treatments   Labs (all labs ordered are listed, but only abnormal results are displayed) Labs Reviewed - No data to display    RADIOLOGY I have reviewed the xray personally and interpreted no evidence of any fractures   PROCEDURES:  Critical Care performed: No  Procedures   MEDICATIONS ORDERED IN ED: Medications - No data to display   IMPRESSION / MDM / ASSESSMENT AND PLAN / ED COURSE  I reviewed the triage vital signs and the nursing notes.   Patient's presentation is most consistent with acute, uncomplicated illness.   Differential includes fracture, dislocation, mass.  Examination is overall reassuring no evidence of septic joint neurovascularly intact. NO cp or sob to suggest ACS I suspect more likely ligamental injury and I recommend she follow-up with orthopedics.  The meantime we discussed lidocaine patches.  Will provide a few muscle relaxers given she does report some muscle spasms mostly to use at nighttime and understands  not to drive on them.  She is already taking Tylenol.  Patient expressed understanding felt comfortable with plan.  X-ray reads are taking a longer amount of time.  Patient would prefer to leave prior to results coming back.     FINAL CLINICAL IMPRESSION(S) / ED DIAGNOSES   Final diagnoses:  Acute pain of right shoulder     Rx / DC Orders   ED Discharge Orders     None        Note:  This document was prepared using Dragon voice recognition software and may include unintentional dictation errors.   Concha Se, MD 01/28/24 1323    Concha Se, MD 01/28/24 [phone]

## 2024-01-28 NOTE — Discharge Instructions (Addendum)
 [facility] has a walk-in clinic that you can go to.  You can also make an appointment with the [facility] clinic.  You will need to further discuss with them to ensure that there is nothing else more acute going on with your shoulder.  Return to the ER for worsening symptoms, fevers or any other concerns  DO not DRIVE or work on flexeril.   [facility]:  Address: [address] [place], [place] [address] Phone: [phone] Hours:  Open ? Closes 9?PM

## 2024-01-28 NOTE — ED Notes (Signed)
 See triage notes. Patient c/o right shoulder pain. Denies injury. Stated this has been going on for a few months.

## 2024-02-09 ENCOUNTER — Telehealth: Payer: Self-pay

## 2024-02-09 NOTE — Telephone Encounter (Signed)
 Called pt to make appt. No answer. Left msg.

## 2024-10-12 ENCOUNTER — Ambulatory Visit: Payer: Self-pay

## 2024-10-12 NOTE — Telephone Encounter (Signed)
 FYI Only or Action Required?: FYI only for provider: appointment scheduled on new patient appointment April 2026.  Patient was last seen in primary care on N/A.  Called Nurse Triage reporting Shortness of Breath, Fatigue, Cough, and Nasal Congestion.  Symptoms began several days ago.  Interventions attempted: Nothing.  Symptoms are: N/A.  Triage Disposition: Go to ED Now (Notify PCP)  Patient/caregiver understands and will follow disposition?: No, refuses disposition- Husband states they will go to urgent care instead       Copied from CRM [id number] [id number]. Topic: Clinical - Red Word Triage >> Oct 12, 2024  3:05 PM [name] wrote: Red Word that prompted transfer to Nurse Triage: Tiffany Shea, the pt husband is calling. He reports the pt is experiencing flu-like symptoms, including congestion and difficulty breathing. Reason for Disposition  [1] MODERATE difficulty breathing (e.g., speaks in phrases, SOB even at rest, pulse 100-120) AND [2] NEW-onset or WORSE than normal  Answer Assessment - Initial Assessment Questions 1. RESPIRATORY STATUS: Describe your breathing? (e.g., wheezing, shortness of breath, unable to speak, severe coughing)      Shortness of breath, cough with yellow to green mucous.  2. ONSET: When did this breathing problem begin?      X 3 days  3. PATTERN Does the difficult breathing come and go, or has it been constant since it started?      Constant, worse when sitting up and coughing.  4. SEVERITY: How bad is your breathing? (e.g., mild, moderate, severe)      Moderate, husband states patient is not able to speak in full sentence without having to catch her breath. He states she has a lot of chest congestion and at times her breathing seems labored. RN stopped triage and advised ED. He declined ED and is agreeable to have patient go to urgent care now.  Protocols used: Breathing Difficulty-A-AH

## 2024-10-13 ENCOUNTER — Emergency Department
Admission: EM | Admit: 2024-10-13 | Discharge: 2024-10-13 | Disposition: A | Discharge status: Home / Self Care | Payer: Self-pay

## 2024-10-13 ENCOUNTER — Emergency Department: Payer: Self-pay

## 2024-10-13 ENCOUNTER — Other Ambulatory Visit: Payer: Self-pay

## 2024-10-13 DIAGNOSIS — J40 Bronchitis, not specified as acute or chronic: Secondary | ICD-10-CM | POA: Insufficient documentation

## 2024-10-13 DIAGNOSIS — F17210 Nicotine dependence, cigarettes, uncomplicated: Secondary | ICD-10-CM | POA: Insufficient documentation

## 2024-10-13 DIAGNOSIS — J4541 Moderate persistent asthma with (acute) exacerbation: Secondary | ICD-10-CM | POA: Insufficient documentation

## 2024-10-13 DIAGNOSIS — Z91010 Allergy to peanuts: Secondary | ICD-10-CM | POA: Insufficient documentation

## 2024-10-13 LAB — CBC
HCT: 44.8 % (ref 36.0–46.0)
Hemoglobin: 15 g/dL (ref 12.0–15.0)
MCH: 31 pg (ref 26.0–34.0)
MCHC: 33.5 g/dL (ref 30.0–36.0)
MCV: 92.6 fL (ref 80.0–100.0)
Platelets: 211 K/uL (ref 150–400)
RBC: 4.84 MIL/uL (ref 3.87–5.11)
RDW: 13.2 % (ref 11.5–15.5)
WBC: 8.2 K/uL (ref 4.0–10.5)
nRBC: 0 % (ref 0.0–0.2)

## 2024-10-13 LAB — RESP PANEL BY RT-PCR (RSV, FLU A&B, COVID)  RVPGX2
Influenza A by PCR: NEGATIVE
Influenza B by PCR: NEGATIVE
Resp Syncytial Virus by PCR: NEGATIVE
SARS Coronavirus 2 by RT PCR: NEGATIVE

## 2024-10-13 LAB — BASIC METABOLIC PANEL WITH GFR
Anion gap: 12 (ref 5–15)
BUN: 6 mg/dL — ABNORMAL LOW (ref 8–23)
CO2: 26 mmol/L (ref 22–32)
Calcium: 9.3 mg/dL (ref 8.9–10.3)
Chloride: 100 mmol/L (ref 98–111)
Creatinine, Ser: 0.58 mg/dL (ref 0.44–1.00)
GFR, Estimated: >60 mL/min (ref >60)
Glucose, Bld: 128 mg/dL — ABNORMAL HIGH (ref 70–99)
Potassium: 3.6 mmol/L (ref 3.5–5.1)
Sodium: 138 mmol/L (ref 135–145)

## 2024-10-13 MED ORDER — METHYLPREDNISOLONE SODIUM SUCC 125 MG IJ SOLR
125.0000 mg | Freq: Once | INTRAMUSCULAR | Status: AC
  Administered 2024-10-13: 125 mg via INTRAVENOUS
  Filled 2024-10-13: qty 2

## 2024-10-13 MED ORDER — IPRATROPIUM-ALBUTEROL 0.5-2.5 (3) MG/3ML IN SOLN
3.0000 mL | Freq: Once | RESPIRATORY_TRACT | Status: AC
  Administered 2024-10-13: 3 mL via RESPIRATORY_TRACT
  Filled 2024-10-13: qty 3

## 2024-10-13 MED ORDER — AZITHROMYCIN 250 MG PO TABS: ORAL_TABLET | ORAL | 0 refills | Status: AC

## 2024-10-13 MED ORDER — ALBUTEROL SULFATE HFA 108 (90 BASE) MCG/ACT IN AERS: 2.0000 | INHALATION_SPRAY | Freq: Four times a day (QID) | RESPIRATORY_TRACT | 2 refills | Status: AC | PRN

## 2024-10-13 MED ORDER — PREDNISONE 10 MG PO TABS: 10.0000 mg | ORAL_TABLET | Freq: Every day | ORAL | 0 refills | Status: AC

## 2024-10-13 NOTE — ED Notes (Signed)
 Patient declined discharge vital signs.

## 2024-10-13 NOTE — ED Notes (Signed)
 Patient states she feels better after the neb treatment. Expiratory wheezes still present and audible, but are more muted. Patient is on room air with pulse ox of 95%. Voice is clear, no pauses noted with conversation.

## 2024-10-13 NOTE — Discharge Instructions (Signed)
 Please take medications as prescribed.  Return to the ER if you have any continued chest tightness, wheezing, worsening cough fevers or any urgent changes in your health.

## 2024-10-13 NOTE — ED Provider Notes (Signed)
 [place] EMERGENCY DEPARTMENT AT [facility] Provider Note   CSN: [id number] Arrival date & time: 10/13/24  1206     Patient presents with: Shortness of Breath   Tiffany Shea is a 63 y.o. female presents to the emergency department for evaluation of shortness of breath.  She has a history of tobacco use, smokes a pack a day for 40 years.  She has a history of hyperlipidemia.  She denies any recent fevers chills or bodyaches.  She has had a dry cough.  No chest pain or shortness of breath.  She does feel chest tightness and has had audible wheezing.  No history of albuterol usage.  Denies any known history of asthma or COPD.  No abdominal pain nausea vomiting or diarrhea.       Prior to Admission medications  Medication Sig Start Date End Date Taking? Authorizing Provider  albuterol (VENTOLIN HFA) 108 (90 Base) MCG/ACT inhaler Inhale 2 puffs into the lungs every 6 (six) hours as needed for wheezing or shortness of breath. 10/13/24  Yes Charlene Debby BROCKS, PA-C  azithromycin (ZITHROMAX Z-PAK) 250 MG tablet Take 2 tablets (500 mg) on  Day 1,  followed by 1 tablet (250 mg) once daily on Days 2 through 5. 10/13/24 10/18/24 Yes Charlene Debby BROCKS, PA-C  predniSONE (DELTASONE) 10 MG tablet Take 1 tablet (10 mg total) by mouth daily. 6,5,4,3,2,1 six day taper 10/13/24  Yes Charlene Debby BROCKS, PA-C  atorvastatin  (LIPITOR) 20 MG tablet Take 1 tablet (20 mg total) by mouth daily at 6 PM. 05/19/19   Doles-Johnson, Teah, NP  mirtazapine  (REMERON ) 15 MG tablet TAKE 1 TABLET BY MOUTH AT BEDTIME 08/25/19   Iloabachie, Chioma E, NP    Allergies: Peanut (diagnostic) and Penicillins    Review of Systems  Updated Vital Signs BP 116/64 (BP Location: Right Arm)   Pulse 76   Temp 98 F (36.7 C) (Oral)   Resp 20   Ht 5' 5 (1.651 m)   Wt 68 kg   SpO2 100%   BMI 24.96 kg/m   Physical Exam Constitutional:      Appearance: She is well-developed.  HENT:     Head: Normocephalic and atraumatic.      Mouth/Throat:     Pharynx: Oropharynx is clear. No oropharyngeal exudate or posterior oropharyngeal erythema.  Eyes:     Conjunctiva/sclera: Conjunctivae normal.  Cardiovascular:     Rate and Rhythm: Normal rate.     Pulses: Normal pulses.     Heart sounds: Normal heart sounds.  Pulmonary:     Effort: Pulmonary effort is normal. No respiratory distress.     Breath sounds: Wheezing (diffuse) present.  Abdominal:     General: There is no distension.     Tenderness: There is no abdominal tenderness. There is no right CVA tenderness, left CVA tenderness or guarding.  Musculoskeletal:        General: Normal range of motion.     Cervical back: Normal range of motion.  Skin:    General: Skin is warm.     Findings: No rash.  Neurological:     Mental Status: She is alert and oriented to person, place, and time.  Psychiatric:        Behavior: Behavior normal.        Thought Content: Thought content normal.     (all labs ordered are listed, but only abnormal results are displayed) Labs Reviewed  BASIC METABOLIC PANEL WITH GFR - Abnormal; Notable for the following  components:      Result Value   Glucose, Bld 128 (*)    BUN 6 (*)    All other components within normal limits  RESP PANEL BY RT-PCR (RSV, FLU A&B, COVID)  RVPGX2  CBC    EKG: EKG Interpretation Date/Time:  Thursday October 13 2024 13:00:41 EST Ventricular Rate:  80 PR Interval:  130 QRS Duration:  88 QT Interval:  382 QTC Calculation: 440 R Axis:   89  Text Interpretation: Normal sinus rhythm Nonspecific ST and T wave abnormality Abnormal ECG When compared with ECG of 30-Nov-2016 04:54, Vent. rate has increased BY  26 BPM Nonspecific T wave abnormality now evident in Lateral leads Confirmed by UNCONFIRMED, DOCTOR ([id number]), editor Alex Slough [phone]) on 10/13/2024 2:25:09 PM  Radiology: DG Chest 2 View Result Date: 10/13/2024 CLINICAL DATA:  Shortness of breath. EXAM: CHEST - 2 VIEW COMPARISON:  10/15/2018.  FINDINGS: The heart size and mediastinal contours are within normal limits. Hyperinflation. Biapical pleural-parenchymal scarring. No focal consolidation, pleural effusion, or pneumothorax. No acute osseous abnormality. IMPRESSION: 1. No acute cardiopulmonary findings. 2. Hyperinflation. Electronically Signed   By: Harrietta Sherry M.D.   On: 10/13/2024 14:31     Procedures   Medications Ordered in the ED  ipratropium-albuterol (DUONEB) 0.5-2.5 (3) MG/3ML nebulizer solution 3 mL (3 mLs Nebulization Given 10/13/24 1705)  methylPREDNISolone sodium succinate (SOLU-MEDROL) 125 mg/2 mL injection 125 mg (125 mg Intravenous Given 10/13/24 1704)  ipratropium-albuterol (DUONEB) 0.5-2.5 (3) MG/3ML nebulizer solution 3 mL (3 mLs Nebulization Given 10/13/24 1743)                                    Medical Decision Making Risk Prescription drug management.   63 year old female with 40-year pack history of of smoking presents with wheezing and cough.  Patient with normal vital signs, nontachycardic with normal O2 sats.  No chest pain but reported wheezing.  Significant wheezing on exam.  She is given 2 DuoNeb treatments and IV Solu-Medrol with near complete resolution of wheezing.  Vital signs remained stable.  She had a negative workup with normal chest x-ray, EKG, CBC and BMP.  Viral panel negative.  Patient discharged home with prednisone, albuterol inhaler, azithromycin.  She will follow-up with pulmonologist.  She is given strict return precautions understands signs symptoms return to the ER for. Final diagnoses:  Moderate persistent asthma with exacerbation  Bronchitis    ED Discharge Orders          Ordered    albuterol (VENTOLIN HFA) 108 (90 Base) MCG/ACT inhaler  Every 6 hours PRN        10/13/24 1843    predniSONE (DELTASONE) 10 MG tablet  Daily        10/13/24 1843    azithromycin (ZITHROMAX Z-PAK) 250 MG tablet        10/13/24 1843               Charlene Debby BROCKS,  PA-C 10/13/24 1847    Clarine Ozell LABOR, MD 10/14/24 1106

## 2024-10-13 NOTE — ED Triage Notes (Signed)
 Pt arrives via POV with c/o SOB, coughing for about a week. Pt sounds wheezy from across the room. Pt states that they have been coughing up cloudy/clear mucous. Pt states that they have fever blisters all over their mouth. Pt is A&Ox4 and ambulatory during triage.

## 2024-10-14 ENCOUNTER — Encounter: Payer: Self-pay | Admitting: Pulmonary Disease

## 2025-02-06 ENCOUNTER — Ambulatory Visit: Payer: Self-pay | Admitting: Nurse Practitioner
